# Patient Record
Sex: Female | Born: 1990
Health system: Southern US, Community
[De-identification: ages and names within clinical notes are randomized; demographics above are authoritative.]

## PROBLEM LIST (undated history)

## (undated) DIAGNOSIS — A048 Other specified bacterial intestinal infections: Secondary | ICD-10-CM

## (undated) DIAGNOSIS — K219 Gastro-esophageal reflux disease without esophagitis: Secondary | ICD-10-CM

## (undated) DIAGNOSIS — D649 Anemia, unspecified: Secondary | ICD-10-CM

## (undated) DIAGNOSIS — Z22322 Carrier or suspected carrier of Methicillin resistant Staphylococcus aureus: Secondary | ICD-10-CM

## (undated) HISTORY — DX: Other specified bacterial intestinal infections: A04.8

## (undated) HISTORY — DX: Gastro-esophageal reflux disease without esophagitis: K21.9

---
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
## 2008-06-15 ENCOUNTER — Emergency Department (HOSPITAL_COMMUNITY): Admission: EM | Admit: 2008-06-15 | Discharge: 2008-06-15 | Payer: Self-pay | Admitting: Emergency Medicine

## 2008-06-26 ENCOUNTER — Other Ambulatory Visit: Admission: RE | Admit: 2008-06-26 | Discharge: 2008-06-26 | Payer: Self-pay | Admitting: Family Medicine

## 2009-07-30 ENCOUNTER — Other Ambulatory Visit: Admission: RE | Admit: 2009-07-30 | Discharge: 2009-07-30 | Payer: Self-pay | Admitting: Obstetrics and Gynecology

## 2009-11-13 ENCOUNTER — Emergency Department (HOSPITAL_COMMUNITY): Admission: EM | Admit: 2009-11-13 | Discharge: 2009-11-13 | Payer: Self-pay | Admitting: Emergency Medicine

## 2010-01-20 ENCOUNTER — Emergency Department (HOSPITAL_COMMUNITY): Admission: EM | Admit: 2010-01-20 | Discharge: 2010-01-20 | Payer: Self-pay | Admitting: Emergency Medicine

## 2010-02-24 ENCOUNTER — Emergency Department (HOSPITAL_COMMUNITY): Admission: EM | Admit: 2010-02-24 | Discharge: 2010-02-24 | Payer: Self-pay | Admitting: Emergency Medicine

## 2010-03-23 ENCOUNTER — Emergency Department (HOSPITAL_COMMUNITY): Admission: EM | Admit: 2010-03-23 | Discharge: 2010-03-23 | Payer: Self-pay | Admitting: Family Medicine

## 2010-11-03 ENCOUNTER — Encounter: Payer: Self-pay | Admitting: Family Medicine

## 2011-01-01 LAB — CBC
Hemoglobin: 14.3 g/dL (ref 12.0–15.0)
MCHC: 34.7 g/dL (ref 30.0–36.0)
MCV: 95.2 fL (ref 78.0–100.0)
RBC: 4.31 MIL/uL (ref 3.87–5.11)

## 2011-01-01 LAB — PROTIME-INR
INR: 1.09 (ref 0.00–1.49)
Prothrombin Time: 14 seconds (ref 11.6–15.2)

## 2011-01-01 LAB — COMPREHENSIVE METABOLIC PANEL
ALT: 14 U/L (ref 0–35)
AST: 18 U/L (ref 0–37)
BUN: 5 mg/dL — ABNORMAL LOW (ref 6–23)
Creatinine, Ser: 0.61 mg/dL (ref 0.4–1.2)
GFR calc non Af Amer: >60 mL/min (ref >60)
Potassium: 3.5 mEq/L (ref 3.5–5.1)
Sodium: 139 mEq/L (ref 135–145)

## 2011-01-01 LAB — POCT PREGNANCY, URINE: Preg Test, Ur: NEGATIVE

## 2011-01-01 LAB — DIFFERENTIAL
Lymphs Abs: 2.3 10*3/uL (ref 0.7–4.0)
Monocytes Absolute: 0.6 10*3/uL (ref 0.1–1.0)
Monocytes Relative: 6 % (ref 3–12)

## 2011-01-01 LAB — URINALYSIS, ROUTINE W REFLEX MICROSCOPIC
Glucose, UA: NEGATIVE mg/dL
Ketones, ur: NEGATIVE mg/dL
Nitrite: NEGATIVE
Nitrite: NEGATIVE
Protein, ur: 100 mg/dL — AB
Specific Gravity, Urine: 1.016 (ref 1.005–1.030)
Specific Gravity, Urine: 1.024 (ref 1.005–1.030)
Urobilinogen, UA: 0.2 mg/dL (ref 0.0–1.0)
pH: 6.5 (ref 5.0–8.0)
pH: 7 (ref 5.0–8.0)

## 2011-01-01 LAB — LIPASE, BLOOD: Lipase: 22 U/L (ref 11–59)

## 2011-01-01 LAB — URINE MICROSCOPIC-ADD ON

## 2011-01-01 LAB — HEMOCCULT GUIAC POC 1CARD (OFFICE): Fecal Occult Bld: NEGATIVE

## 2011-01-01 LAB — APTT: aPTT: 28 seconds (ref 24–37)

## 2011-02-25 NOTE — Consult Note (Signed)
NAMESHARRY, BEINING               ACCOUNT NO.:  [id number]   MEDICAL RECORD NO.:  [id number]          PATIENT TYPE:  INP   LOCATION:  5511                         FACILITY:  [facility]   PHYSICIAN:  Antonietta Breach, M.D.  DATE OF BIRTH:  [birth year]   DATE OF CONSULTATION:  08/06/2007  DATE OF DISCHARGE:  08/07/2007                                 CONSULTATION   REASON FOR CONSULTATION:  Anxiety; adjust psychotropic medication.   HISTORY OF PRESENT ILLNESS:  Ms. [name] is an 20 year old female  admitted to the [facility] on August 05, 2007 after  experiencing periods of disequilibrium and falls.   The patient has been undergoing an organic evaluation of her falls and  feelings of imbalance.  There has been a negative organic workup.  The  patient does describe a number of stresses that she has been  experiencing.  She had anticipated going back to her favorite [place]  mom; however, that has not happened.  She is still grieving over a death  in the family.  Also, a good friend of hers died recently.  She does not  have any suicidal thoughts.  She has no thoughts of harming others.  She  has no hallucinations or delusions.  She does describe normal interests  in music.  She is not experiencing any racing thoughts.  She is  cooperative with bedside care and is socially appropriate.   Dictation ended at this point.      Antonietta Breach, M.D.  Electronically Signed     JW/MEDQ  D:  08/09/2007  T:  08/09/2007  Job:  [id number]
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
## 2011-02-25 NOTE — H&P (Signed)
[name], [name]               ACCOUNT NO.:  [id number]   MEDICAL RECORD NO.:  [id number]          PATIENT TYPE:  OBV   LOCATION:  5511                         FACILITY:  [facility]   PHYSICIAN:  Beckey Rutter, MD  DATE OF BIRTH:  [birth year]   DATE OF ADMISSION:  08/05/2007  DATE OF DISCHARGE:                              HISTORY & PHYSICAL   PRIMARY CARE PHYSICIAN:  Unassigned.   CHIEF COMPLAINT:  Fall.   HISTORY OF PRESENT ILLNESS:  This is an 20 year old Caucasian female  with past medical history of bipolar disorder, anxiety, depression and  self mutilation who presented today after she fell while she was  standing up.  The history was taken from the patient and her mother at  the bedside. The mother stated that she was just standing up when she  collapsed.  She said the patient did not hit the floor hard because some  people were trying to walk her since she started to feel weak and unable  to walk normally.  They denied urinary or fecal incontinence.  The  patient stated that she did not lose consciousness completely, but she  was feeling dizzy, lightheaded, and she felt the room was spinning  around her.  The patient then continued to feel weak and dizzy and  sometimes even nauseated, and she denied any similar spells in the past.  She stated that she has the dizzy feeling and the nausea for sometimes  on and off, and she also admitted to having some headaches also in an on  and off pattern.  She denied using any controlled substance other than  her prescription medication.  The patient has history of suicide attempt  and self cutting in the past but no history of medication overdose.   PAST MEDICAL HISTORY:  Is significant for:  1. Behavioral Health recent discharge.  2. Bipolar disorder.  3. Anxiety.  4. Depression.  5. Cutting self and suicidal ideation.   SOCIAL HISTORY:  The patient lives with a [place] mother. She denied  substance abuse.  Admits to occasional  smoking, and she denied alcohol  abuse.   FAMILY HISTORY:  The patient is unaware of her family history.   MEDICATION ALLERGY:  She is not known to have medication allergy.   MEDICATIONS:  1. Lamictal.  2. Seroquel.  3. Strattera.  4. Zoloft  5. Birth control pills.   REVIEW OF SYSTEMS:  The patient denied recent fever but admits to having  strep throat in the last week or so, and she admits to having some ear  problem to the right ear, though she denied recent discharge or recent  severe pain on the right ear.  The rest of Review of Systems as per HPI.   PHYSICAL EXAMINATION:  GENERAL:  The patient was lying flat in bed not  in acute distress.  HEENT:  Head:  Atraumatic, normocephalic.  Eyes: PERRLA.  Mouth moist.  No ulcer.  Ears:  The patient has perforated tympanic membrane in the  right ear. No obvious discharge.  NECK:  Supple.  The JVD.  LUNGS:  Bilateral fair air entry.  PRECORDIUM:  First and second heart sounds audible.  No added sounds.  ABDOMEN:  Soft, nontender.  Bowel sounds present.  EXTREMITIES: No lower extremities edema.  NEUROLOGIC:  Patient talking in a low voice but giving history and  engaging in the conversation.  She is alert, oriented x3.  On gait, her  gait seems stable when she walks, but when she stands up, she tends to  fall, not in one specific direction. She also needs a little bit of help  to climb up to bed.  She moves all of her extremities spontaneously.   LABORATORY AND X-RAY DATA:  EKG shows sinus rhythm with 95 beats per  minute heart rate, normal axis, normal QRS complex, and nonspecific T  wave changes.   Urine drug screen is positive for benzodiazepines only.  Her sodium is  133, potassium 4.1, chloride 104, carbon dioxide 24, glucose 114, BUN  10, creatinine 0.7, calcium 9.1, albumin  3.2 ALT 11, alkaline  phosphatase 96, total bilirubin 0.6.  Salicylate is less than 4.0.  Alcohol level is less than 5.  White blood count is 7.2,  hemoglobin  11.5, hematocrit 34.1, platelet count 300.   ASSESSMENT AND PLAN:  This is an 20 year old who came with drowsiness  and fall.  The patient, as per history, is unlikely to have cardiogenic  cause for her syncope, but she seems to have neurologic cause or  labyrinth problem mainly secondary to the infection in the right ear.  The patient up to now  is still having difficulty standing without  assistance, and I suspect her symptoms are secondary to excessive  sedative and psychotropic medication.  The patient will be admitted to  the telemetry floor for further assessment and management.  1. I will hold all medications for tonight and will obtain levels and      reassess her ability to stand in the morning.  I will also start      the patient on antibiotics for her right ear, and I will obtain CT      head or MRI imaging of the right ear.  2. Depending on the chest x-ray, I will consider 2-D echocardiogram      and carotid Doppler, though, again as per history, her symptoms are      unlikely to be cardiogenic in origin.  3. I will consider Lovenox for deep vein thrombosis prophylaxis.  4. I will also will consider psychiatric consultation if this patient      stays more than 24 hours in the hospital.      Beckey Rutter, MD  Electronically Signed     EME/MEDQ  D:  08/05/2007  T:  08/06/2007  Job:  [id number]

## 2011-02-28 NOTE — Discharge Summary (Signed)
[name], [name]               ACCOUNT NO.:  [id number]   MEDICAL RECORD NO.:  [id number]          PATIENT TYPE:  INP   LOCATION:  0101                          FACILITY:  [facility]   PHYSICIAN:  Lalla Brothers, MDDATE OF BIRTH:  [birth year]   DATE OF ADMISSION:  07/12/2007  DATE OF DISCHARGE:  07/19/2007                               DISCHARGE SUMMARY   IDENTIFICATION:  This 49[age]1/22-year-old female, senior at [employer], was admitted emergently voluntarily on referral from [name],  her psychotherapist, for inpatient stabilization and treatment of  suicide risk and depression.  The patient's self-injurious behavior over  the last several days had resulted in requiring 22 sutures in the left  breast for self-cutting, having progressive associated suicidal  ideation.  The patient has had significant relational disruption past  and present with that of the present reenacting the more severe of the  past.  For full details, please see the typed admission assessment.   SYNOPSIS OF PRESENT ILLNESS:  The patient has been under the custody of  [facility], Loreen Freud, at [phone]  with parental rights terminated at age 5, though she had been receiving  services and out of home placement since age 20 years.  The patient had  required a level five group home, at one point having multiple  hospitalizations since at least 2004 for self-mutilation, disruptive  behavior and depression and post-traumatic stress.  Biological parents  did have mental illness.  The patient has difficulty with individuation  separation as a teen and is somewhat overwhelmed with the prospect of  graduating from high school.  She has been depressed for the last four  hospitalizations.  Medications have been adjusted very slowly with  Seroquel decreased to 200 mg nightly, including for borderline  elevations of LDL cholesterol and triglycerides as well as being  somewhat  overweight.  At the time of admission, the patient is taking  Strattera 80 mg every morning, Zoloft 100 mg every morning, Lamictal 200  mg morning and bedtime, Klonopin 0.5 mg morning and bedtime and Seroquel  200 mg nightly.  She is also on Tri-Sprintec birth control pill and is  allergic to TOPAMAX, FOCALIN and PROZAC by history.  She had [facility] care  for four years with Lynne Logan after her level five group home  placement that is now for the last eight months been with a new [place]  mother as Agustin Cree is getting a divorce.  The patient seems to anticipate  returning to Darlene's home and has to face during this hospitalization  that that is not going to happen.   INITIAL MENTAL STATUS EXAM:  The patient is right-handed with intact  neurological exam.  She is severely dysphoric though she is asking for  help.  She becomes overwhelmed and drowsy just thinking about her losses  and changes in her life.  She has moderate post-traumatic anxiety with  reenactment self-injury following reexperiencing triggers.  She is not  manic or psychotic currently though she does have a history of a bipolar  diagnosis.  She is  having suicidal ideation and progressively severe and  serious self-injury.   LABORATORY FINDINGS:  In the emergency department, for suturing of her  breast laceration, basic metabolic panel was normal with random glucose  105, sodium 139, potassium 3.7, calcium 9 and creatinine 0.74.  Urine  drug screen was positive for benzodiazepine from her Klonopin otherwise  negative as was blood alcohol.  Urine pregnancy test was negative.  Salicylate and acetaminophen were negative.  At the [facility], CBC was normal except white count upper limit of normal at  10,700 with upper limit of normal 10,000 and platelet count was slightly  elevated at 407,000 with upper limit of normal 325,000.  Hemoglobin was  normal at 12.5, MCV at 88.7 and WBC differential was normal.   Hepatic  function panel was normal with albumin 3.5, total bilirubin 0.5, AST 18,  ALT 15 and GGT 26.  Basic metabolic panel was normal with sodium 140,  potassium 3.8, random glucose 94, creatinine 0.95 and calcium 10.  Free  T4 was normal at 0.96 and TSH at 3.789.  Urinalysis was normal with  specific gravity of 1.023 and pH 5.5 with 0-2 wbc, 0-2 rbc and a few  bacteria with some mucus present.   HOSPITAL COURSE AND TREATMENT:  General medical exam by Jorje Guild PA-C  noted ventilation tubes in the tympanic membranes and a history of  pneumonia around age 20.  She had cat scratch disease at age 20.  She has  an occasional cigarette.  She had menarche at age 20 with regular menses  and has been sexually active apparently once in the interim since her  last hospitalization and then has ceased sexual activity again.  BMI is  25.8.  Height was 150 cm and weight on admission was 58 kg and on  discharge was 59.4 kg, having been 57.7 kg in July of 2007 at the time  of her last hospitalization.  Initial supine blood pressure was 111/65  with heart rate of 77 and standing blood pressure 111/74 with heart rate  of 126.  On the day before discharge, supine blood pressure was 139/68  with heart rate of 122 and standing blood pressure was 115/70 with heart  rate of 110.  Two days prior to discharge, supine blood pressure was  93/60 with heart rate of 74 and standing blood pressure 101/62 with  heart rate of 97.  Sitting blood pressure on the day of discharge was  135/81 with heart rate of 135.  The patient seems to have varying vital  signs based on a pattern of no nutrition and somewhat involutional and  regressive underactivity.  The patient's Seroquel was reduced from 200  mg to 100 mg nightly and otherwise medications were continued without  change with Tri-Sprintec being brought from [place] home.  The patient  decompensated further after a TDM meeting disclosed to her that she  would not be  returning to Darlene's home.  Subsequently, the patient  began to work step by step on how to emotionally cope and maturely  advance her level of responsibility and activity for finding more  rewarding relations and investment in her future.  The patient reworked  past trauma to the extent that she could be more constructive in  upcoming relationships.  She visited with current [place] mother and the  [place] mother's baby on the unit.  The patient manifested some maturity  and asserting to staff that she can legally sign herself out of  the  hospital at discharge because she is 16.  The patient gradually improved  relative to her self-injurious ideation, risk and acting upon such.  Sutures were removed prior to discharge and wound was healing adequately  relative to left breast self-inflicted injuries.  The patient partially  worked through her regression though having a headache on the morning of  discharge.  She was still sleeping too much at times but overall  motivated to participate in treatment.  Suicidal ideation resolved and  she was no longer self-injurious.  She was discharged in improved  condition to return to her [place] home.  She is motivated to return to  school at the time of discharge.  She required no seclusion or restraint  during the hospital stay.   FINAL DIAGNOSES:  AXIS I:  Bipolar disorder, depressed, severe.  Post-  traumatic stress disorder.  Attention-deficit hyperactivity disorder,  combined-type, moderate severity.  Oppositional defiant disorder.  Other  interpersonal problem.  Other specified family circumstances.  Parent-  child problem.  AXIS II:  Diagnosis deferred.  AXIS III:  Self-inflicted lacerations left breast, overweight with  history of borderline elevation of LDL cholesterol and triglycerides at  133 and 191 respectively, short stature, allergy to TOPAMAX and FOCALIN  and PROZAC, iron deficiency anemia resolved.  AXIS IV:  Stressors:   Family--extreme, acute and chronic; phase of life-  -extreme, acute and chronic; peer relations--moderate, acute and  chronic; school--moderate, acute and chronic.  AXIS V:  GAF on admission 35; highest in last year estimated at 65;  discharge GAF 53.   CONDITION ON DISCHARGE:  The patient was discharged to [place] mother in  improved condition, free of suicidal and homicidal ideation.   ACTIVITY/DIET:  She follows a weight and fat control diet and has no  restrictions on physical activity.  Left breast wounds are healing well  requiring only protection from further injury and scar care.  She  requires no pain management.  Crisis and safety plans are outlined if  needed.  She is discharged on the following medication.   DISCHARGE MEDICATIONS:  1. Strattera 80 mg every morning; quantity #30 prescribed.  2. Zoloft 100 mg every morning; quantity #30 prescribed.  3. Lamictal 200 mg every morning and bedtime; quantity #60 prescribed.  4. Seroquel was reduced to 100 mg tablet every bedtime; quantity #30      prescribed.  5. Klonopin 0.5 mg every morning and bedtime; quantity #60 prescribed.  6. Tri-Sprintec 1 daily; own home supply is continued.   FOLLOWUP:  She sees Dr. Elsie Saas August 16, 2007 at 1715 for  psychiatry follow-up.  She sees [name] at [facility] July 19, 2007 at 4782 for therapy.  She is educated on the  medication including adjustments and guidelines.      Lalla Brothers, MD  Electronically Signed     GEJ/MEDQ  D:  07/21/2007  T:  07/21/2007  Job:  [id number]
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
## 2011-07-16 LAB — POCT I-STAT, CHEM 8
BUN: 13
Calcium, Ion: 1.26
Chloride: 107
TCO2: 25

## 2011-07-16 LAB — URINALYSIS, ROUTINE W REFLEX MICROSCOPIC
Ketones, ur: NEGATIVE
Nitrite: NEGATIVE
pH: 5

## 2011-07-16 LAB — POCT PREGNANCY, URINE: Preg Test, Ur: NEGATIVE

## 2011-07-16 LAB — DIFFERENTIAL
Eosinophils Absolute: 0.1
Eosinophils Relative: 2
Lymphocytes Relative: 29
Monocytes Relative: 10
Neutrophils Relative %: 59

## 2011-07-16 LAB — CBC
MCV: 93.6
WBC: 6.9

## 2011-07-22 ENCOUNTER — Inpatient Hospital Stay (INDEPENDENT_AMBULATORY_CARE_PROVIDER_SITE_OTHER)
Admission: RE | Admit: 2011-07-22 | Discharge: 2011-07-22 | Disposition: A | Discharge status: Home / Self Care | Payer: Self-pay | Source: Ambulatory Visit | Attending: Emergency Medicine | Admitting: Emergency Medicine

## 2011-07-22 DIAGNOSIS — L02219 Cutaneous abscess of trunk, unspecified: Secondary | ICD-10-CM

## 2011-07-22 LAB — COMPREHENSIVE METABOLIC PANEL
Alkaline Phosphatase: 113
BUN: 8
CO2: 26
Chloride: 102
GFR calc non Af Amer: >60
Glucose, Bld: 75
Potassium: 3.9
Total Bilirubin: 0.6

## 2011-07-22 LAB — DRUGS OF ABUSE SCREEN W/O ALC, ROUTINE URINE
Amphetamine Screen, Ur: NEGATIVE
Barbiturate Quant, Ur: NEGATIVE
Benzodiazepines.: NEGATIVE
Phencyclidine (PCP): NEGATIVE
Propoxyphene: NEGATIVE

## 2011-07-22 LAB — LIPID PANEL: Cholesterol: 245 — ABNORMAL HIGH

## 2011-07-22 LAB — URINALYSIS, ROUTINE W REFLEX MICROSCOPIC
Glucose, UA: NEGATIVE
Hgb urine dipstick: NEGATIVE
Specific Gravity, Urine: 1.033 — ABNORMAL HIGH

## 2011-07-22 LAB — FERRITIN: Ferritin: 18 (ref 10–291)

## 2011-07-23 LAB — RAPID URINE DRUG SCREEN, HOSP PERFORMED
Barbiturates: NOT DETECTED
Cocaine: NOT DETECTED
Opiates: NOT DETECTED

## 2011-07-23 LAB — COMPREHENSIVE METABOLIC PANEL
AST: 16
Albumin: 3 — ABNORMAL LOW
Albumin: 3.2 — ABNORMAL LOW
Alkaline Phosphatase: 96
BUN: 10
BUN: 7
Calcium: 9.6
Chloride: 104
Chloride: 104
Creatinine, Ser: 0.67
Creatinine, Ser: 0.7
GFR calc Af Amer: >60
GFR calc non Af Amer: >60
GFR calc non Af Amer: >60
Glucose, Bld: 114 — ABNORMAL HIGH
Total Bilirubin: 0.4
Total Bilirubin: 0.6

## 2011-07-23 LAB — CK TOTAL AND CKMB (NOT AT ARMC)
CK, MB: 0.8
Relative Index: INVALID
Total CK: 47

## 2011-07-23 LAB — DIFFERENTIAL
Basophils Absolute: 0
Basophils Relative: 0
Lymphocytes Relative: 32
Monocytes Absolute: 0.6
Neutro Abs: 4.1
Neutrophils Relative %: 57

## 2011-07-23 LAB — CBC
HCT: 33.8 — ABNORMAL LOW
HCT: 34.1 — ABNORMAL LOW
Hemoglobin: 11.5 — ABNORMAL LOW
MCHC: 33.7
MCV: 87.8
MCV: 89.2
Platelets: 300
Platelets: 304
WBC: 7.2

## 2011-07-23 LAB — CARDIAC PANEL(CRET KIN+CKTOT+MB+TROPI)
CK, MB: 0.5
Relative Index: INVALID
Relative Index: INVALID
Total CK: 42
Troponin I: 0.02

## 2011-07-23 LAB — SALICYLATE LEVEL: Salicylate Lvl: <4

## 2011-07-23 LAB — TROPONIN I: Troponin I: 0.01

## 2011-07-24 LAB — RAPID URINE DRUG SCREEN, HOSP PERFORMED
Amphetamines: NOT DETECTED
Barbiturates: NOT DETECTED
Opiates: NOT DETECTED
Tetrahydrocannabinol: NOT DETECTED

## 2011-07-24 LAB — DIFFERENTIAL
Basophils Absolute: 0
Lymphocytes Relative: 24
Lymphs Abs: 2.5
Monocytes Absolute: 0.8
Monocytes Relative: 8
Neutro Abs: 7.2 — ABNORMAL HIGH

## 2011-07-24 LAB — URINALYSIS, MICROSCOPIC ONLY
Glucose, UA: NEGATIVE
Ketones, ur: NEGATIVE
Leukocytes, UA: NEGATIVE
pH: 5.5

## 2011-07-24 LAB — BASIC METABOLIC PANEL
Calcium: 10
Calcium: 9
GFR calc Af Amer: >60
GFR calc Af Amer: >60
GFR calc non Af Amer: >60
GFR calc non Af Amer: >60
Potassium: 3.7
Sodium: 139
Sodium: 140

## 2011-07-24 LAB — HEPATIC FUNCTION PANEL
ALT: 15
AST: 18
Albumin: 3.5
Alkaline Phosphatase: 114
Total Bilirubin: 0.5

## 2011-07-24 LAB — CBC
Hemoglobin: 12.5
RBC: 4.15

## 2011-07-24 LAB — SALICYLATE LEVEL: Salicylate Lvl: <4

## 2011-07-24 LAB — ACETAMINOPHEN LEVEL: Acetaminophen (Tylenol), Serum: <10 — ABNORMAL LOW

## 2011-07-24 LAB — GAMMA GT: GGT: 26

## 2011-07-25 LAB — CULTURE, ROUTINE-ABSCESS

## 2012-02-19 ENCOUNTER — Other Ambulatory Visit: Payer: Self-pay

## 2012-02-19 ENCOUNTER — Other Ambulatory Visit: Payer: Self-pay | Admitting: Internal Medicine

## 2012-02-20 ENCOUNTER — Ambulatory Visit
Admission: RE | Admit: 2012-02-20 | Discharge: 2012-02-20 | Disposition: A | Discharge status: Home / Self Care | Payer: Self-pay | Source: Ambulatory Visit | Attending: Internal Medicine | Admitting: Internal Medicine

## 2015-01-15 ENCOUNTER — Emergency Department (HOSPITAL_BASED_OUTPATIENT_CLINIC_OR_DEPARTMENT_OTHER): Payer: No Typology Code available for payment source

## 2015-01-15 ENCOUNTER — Encounter (HOSPITAL_BASED_OUTPATIENT_CLINIC_OR_DEPARTMENT_OTHER): Payer: Self-pay

## 2015-01-15 ENCOUNTER — Emergency Department (HOSPITAL_BASED_OUTPATIENT_CLINIC_OR_DEPARTMENT_OTHER)
Admission: EM | Admit: 2015-01-15 | Discharge: 2015-01-15 | Disposition: A | Discharge status: Home / Self Care | Payer: No Typology Code available for payment source | Attending: Emergency Medicine | Admitting: Emergency Medicine

## 2015-01-15 DIAGNOSIS — R000 Tachycardia, unspecified: Secondary | ICD-10-CM | POA: Insufficient documentation

## 2015-01-15 DIAGNOSIS — R05 Cough: Secondary | ICD-10-CM

## 2015-01-15 DIAGNOSIS — R059 Cough, unspecified: Secondary | ICD-10-CM

## 2015-01-15 DIAGNOSIS — Z720 Tobacco use: Secondary | ICD-10-CM | POA: Insufficient documentation

## 2015-01-15 DIAGNOSIS — J40 Bronchitis, not specified as acute or chronic: Secondary | ICD-10-CM | POA: Insufficient documentation

## 2015-01-15 DIAGNOSIS — R1110 Vomiting, unspecified: Secondary | ICD-10-CM | POA: Insufficient documentation

## 2015-01-15 MED ORDER — ALBUTEROL SULFATE HFA 108 (90 BASE) MCG/ACT IN AERS: 2.0000 | INHALATION_SPRAY | Freq: Once | RESPIRATORY_TRACT | Status: DC

## 2015-01-15 MED ORDER — IPRATROPIUM-ALBUTEROL 0.5-2.5 (3) MG/3ML IN SOLN
3.0000 mL | Freq: Once | RESPIRATORY_TRACT | Status: AC
  Administered 2015-01-15: 3 mL via RESPIRATORY_TRACT
  Filled 2015-01-15: qty 3

## 2015-01-15 MED ORDER — ALBUTEROL SULFATE HFA 108 (90 BASE) MCG/ACT IN AERS
2.0000 | INHALATION_SPRAY | Freq: Once | RESPIRATORY_TRACT | Status: AC
  Administered 2015-01-15: 2 via RESPIRATORY_TRACT

## 2015-01-15 MED ORDER — IPRATROPIUM BROMIDE 0.02 % IN SOLN: 0.5000 mg | Freq: Once | RESPIRATORY_TRACT | Status: DC

## 2015-01-15 MED ORDER — ALBUTEROL SULFATE HFA 108 (90 BASE) MCG/ACT IN AERS
INHALATION_SPRAY | RESPIRATORY_TRACT | Status: AC
  Filled 2015-01-15: qty 6.7

## 2015-01-15 MED ORDER — ALBUTEROL SULFATE (2.5 MG/3ML) 0.083% IN NEBU
2.5000 mg | INHALATION_SOLUTION | Freq: Once | RESPIRATORY_TRACT | Status: AC
  Administered 2015-01-15: 2.5 mg via RESPIRATORY_TRACT
  Filled 2015-01-15: qty 3

## 2015-01-15 NOTE — ED Provider Notes (Signed)
CSN: [id number]     Arrival date & time 01/15/15  1302 History   First MD Initiated Contact with Patient 01/15/15 1308     Chief Complaint  Patient presents with  . Cough     (Consider location/radiation/quality/duration/timing/severity/associated sxs/prior Treatment) HPI Comments: 24 year old female complaining of cough and fever 4 days. Cough is productive with dark green phlegm. Admits to subjective fever and chills. Many other people at work are sick with similar symptoms, and have been diagnosed with bronchitis. Admits to nausea and one episode of vomiting earlier today after trying to take Robitussin. Denies shortness of breath. She is a smoker.  Patient is a 24 y.o. female presenting with cough. The history is provided by the patient and the spouse.  Cough Associated symptoms: chills and fever     History reviewed. No pertinent past medical history. History reviewed. No pertinent past surgical history. No family history on file. History  Substance Use Topics  . Smoking status: Current Every Day Smoker  . Smokeless tobacco: Not on file  . Alcohol Use: No   OB History    No data available     Review of Systems  Constitutional: Positive for fever and chills.  Respiratory: Positive for cough.   Gastrointestinal: Positive for vomiting.  All other systems reviewed and are negative.     Allergies  Review of patient's allergies indicates no known allergies.  Home Medications   Prior to Admission medications   Not on File   BP 131/83 mmHg  Pulse 104  Temp(Src) 99.1 F (37.3 C) (Oral)  Resp 16  Ht 4' 11.5" (1.511 m)  SpO2 100%  LMP 12/14/2014 Physical Exam  Constitutional: She is oriented to person, place, and time. She appears well-developed and well-nourished. No distress.  HENT:  Head: Normocephalic and atraumatic.  Mouth/Throat: Oropharynx is clear and moist.  Eyes: Conjunctivae and EOM are normal.  Neck: Normal range of motion. Neck supple.   Cardiovascular: Normal rate, regular rhythm and normal heart sounds.   Mildly tachycardic.  Pulmonary/Chest: Effort normal. No respiratory distress.  Few, mild scattered rhonchi bilateral.  Musculoskeletal: Normal range of motion. She exhibits no edema.  Neurological: She is alert and oriented to person, place, and time. No sensory deficit.  Skin: Skin is warm and dry.  Psychiatric: She has a normal mood and affect. Her behavior is normal.  Nursing note and vitals reviewed.   ED Course  Procedures (including critical care time) Labs Review Labs Reviewed - No data to display  Imaging Review Dg Chest 2 View  01/15/2015   CLINICAL DATA:  Productive cough for 4 days.  EXAM: CHEST  2 VIEW  COMPARISON:  None.  FINDINGS: The heart size and mediastinal contours are within normal limits. Both lungs are clear. The visualized skeletal structures are unremarkable.  IMPRESSION: No active cardiopulmonary disease.   Electronically Signed   By: Jolaine Click M.D.   On: 01/15/2015 13:35     EKG Interpretation None      MDM   Final diagnoses:  Bronchitis  Cough   Pt with fevers, productive cough, scattered ronchi. Non-toxic appearing, no respiratory distress. O2 sat 98% on RA. She is a smoker. Plan to obtain chest x-ray, and give DuoNeb.  1:42 PM Chest x-ray clear. Lungs completely clear after DuoNeb treatment. Will discharge patient home with albuterol inhaler. Advised over-the-counter Delsym or Robitussin for cough. Stable for discharge. Return precautions given. Patient states understanding of treatment care plan and is agreeable.  Nada Boozer  Lashina Milles, PA-C 01/15/15 1609  Eber HongBrian Miller, MD 01/15/15 2032

## 2015-01-15 NOTE — ED Notes (Signed)
Cough/fever since 4/1

## 2015-01-15 NOTE — Discharge Instructions (Signed)

## 2015-02-08 ENCOUNTER — Emergency Department (HOSPITAL_BASED_OUTPATIENT_CLINIC_OR_DEPARTMENT_OTHER)
Admission: EM | Admit: 2015-02-08 | Discharge: 2015-02-08 | Disposition: A | Discharge status: Home / Self Care | Payer: No Typology Code available for payment source | Attending: Emergency Medicine | Admitting: Emergency Medicine

## 2015-02-08 ENCOUNTER — Encounter (HOSPITAL_BASED_OUTPATIENT_CLINIC_OR_DEPARTMENT_OTHER): Payer: Self-pay | Admitting: *Deleted

## 2015-02-08 DIAGNOSIS — L0291 Cutaneous abscess, unspecified: Secondary | ICD-10-CM

## 2015-02-08 DIAGNOSIS — Z720 Tobacco use: Secondary | ICD-10-CM | POA: Insufficient documentation

## 2015-02-08 DIAGNOSIS — L02211 Cutaneous abscess of abdominal wall: Secondary | ICD-10-CM | POA: Insufficient documentation

## 2015-02-08 MED ORDER — CLINDAMYCIN HCL 150 MG PO CAPS: 150.0000 mg | ORAL_CAPSULE | Freq: Four times a day (QID) | ORAL | Status: DC

## 2015-02-08 MED ORDER — LIDOCAINE-EPINEPHRINE (PF) 2 %-1:200000 IJ SOLN
INTRAMUSCULAR | Status: AC
  Filled 2015-02-08: qty 10

## 2015-02-08 MED ORDER — HYDROCODONE-ACETAMINOPHEN 5-325 MG PO TABS: 2.0000 | ORAL_TABLET | ORAL | Status: DC | PRN

## 2015-02-08 NOTE — ED Notes (Signed)
MD at bedside. 

## 2015-02-08 NOTE — ED Provider Notes (Signed)
CSN: [id number]     Arrival date & time 02/08/15  1541 History   First MD Initiated Contact with Patient 02/08/15 1600     Chief Complaint  Patient presents with  . Skin Problem     (Consider location/radiation/quality/duration/timing/severity/associated sxs/prior Treatment) HPI  History reviewed. No pertinent past medical history. History reviewed. No pertinent past surgical history. History reviewed. No pertinent family history. History  Substance Use Topics  . Smoking status: Current Every Day Smoker  . Smokeless tobacco: Not on file  . Alcohol Use: No   OB History    No data available     Review of Systems    Allergies  Review of patient's allergies indicates no known allergies.  Home Medications   Prior to Admission medications   Medication Sig Start Date End Date Taking? Authorizing Provider  clindamycin (CLEOCIN) 150 MG capsule Take 1 capsule (150 mg total) by mouth every 6 (six) hours. 02/08/15   Nelva Nayobert Ivory Bail, MD   BP 131/80 mmHg  Pulse 91  Temp(Src) 99.2 F (37.3 C) (Oral)  Resp 22  SpO2 99%  LMP 12/14/2014 Physical Exam  Constitutional: She is oriented to person, place, and time. She appears well-developed and well-nourished. No distress.  HENT:  Head: Normocephalic and atraumatic.  Eyes: Pupils are equal, round, and reactive to light.  Neck: Normal range of motion.  Cardiovascular: Normal rate and intact distal pulses.   Pulmonary/Chest: No respiratory distress.  Abdominal: Normal appearance. She exhibits no distension.  Musculoskeletal: Normal range of motion.  Neurological: She is alert and oriented to person, place, and time. No cranial nerve deficit.  Skin: Skin is warm and dry. No rash noted.     Psychiatric: She has a normal mood and affect. Her behavior is normal.  Nursing note and vitals reviewed.   ED Course  INCISION AND DRAINAGE Date/Time: 02/08/2015 4:19 PM Performed by: Nelva NayBEATON, Quinlyn Tep Authorized by: Nelva NayBEATON, Aliesha Dolata Consent:  Verbal consent obtained. Written consent not obtained. Risks and benefits: risks, benefits and alternatives were discussed Consent given by: patient Patient identity confirmed: verbally with patient Type: abscess Body area: trunk Location details: abdomen Anesthesia: local infiltration Local anesthetic: lidocaine 2% with epinephrine Anesthetic total: 5 ml Patient sedated: no Scalpel size: 11 Needle gauge: 22 Incision type: single straight Complexity: simple Drainage: purulent Drainage amount: moderate Wound treatment: wound left open Packing material: none Patient tolerance: Patient tolerated the procedure well with no immediate complications   (including critical care time)   Labs Review Labs Reviewed - No data to display  Imaging Review No results found.   EKG Interpretation None      MDM   Final diagnoses:  Abscess       Nelva Nayobert Gaelle Adriance, MD 02/08/15 1620

## 2015-02-08 NOTE — Discharge Instructions (Signed)

## 2015-02-08 NOTE — ED Notes (Signed)
Pt amb to triage with quick steady gait smiling in nad, reports "bump" to her pubic line since shaving on Monday. Pt states it has gotten bigger since then.

## 2015-02-11 ENCOUNTER — Telehealth: Payer: Self-pay | Admitting: Emergency Medicine

## 2015-02-11 LAB — CULTURE, ROUTINE-ABSCESS

## 2015-02-11 NOTE — Telephone Encounter (Signed)
Positive Abscess culture + MRSA Treated with Clindamycin, sensitive to same per protocol MD Will contact patient with positive result.

## 2015-02-12 ENCOUNTER — Telehealth (HOSPITAL_COMMUNITY): Payer: Self-pay

## 2015-02-12 NOTE — Telephone Encounter (Signed)
Post ED Visit - Positive Culture Follow-up  Culture report reviewed by antimicrobial stewardship pharmacist: []  Wes Dulaney, Pharm.D., BCPS [x]  Celedonio MiyamotoJeremy Frens, Pharm.D., BCPS []  Georgina PillionElizabeth Martin, Pharm.D., BCPS []  [place] BeachMinh Pham, [address]Ph[address]D., BCPS, AAHIVP []  Estella HuskMichelle Turner, Pharm.D., BCPS, AAHIVP []  Elder CyphersLorie Poole, [address]Pharm[address]D., BCPS  Positive Abscess culture, -> moderate MRSA Treated with Clindamycin, organism sensitive to the same and no further patient follow-up is required at this time.  Arvid RightClark, Gracianna Vink Dorn 02/12/2015, 7:05 PM

## 2015-02-14 ENCOUNTER — Telehealth (HOSPITAL_BASED_OUTPATIENT_CLINIC_OR_DEPARTMENT_OTHER): Payer: Self-pay | Admitting: Emergency Medicine

## 2015-03-11 ENCOUNTER — Emergency Department (HOSPITAL_BASED_OUTPATIENT_CLINIC_OR_DEPARTMENT_OTHER)
Admission: EM | Admit: 2015-03-11 | Discharge: 2015-03-11 | Disposition: A | Discharge status: Home / Self Care | Payer: No Typology Code available for payment source | Attending: Emergency Medicine | Admitting: Emergency Medicine

## 2015-03-11 ENCOUNTER — Encounter (HOSPITAL_BASED_OUTPATIENT_CLINIC_OR_DEPARTMENT_OTHER): Payer: Self-pay

## 2015-03-11 ENCOUNTER — Emergency Department (HOSPITAL_BASED_OUTPATIENT_CLINIC_OR_DEPARTMENT_OTHER): Payer: No Typology Code available for payment source

## 2015-03-11 DIAGNOSIS — L03119 Cellulitis of unspecified part of limb: Secondary | ICD-10-CM

## 2015-03-11 DIAGNOSIS — L0291 Cutaneous abscess, unspecified: Secondary | ICD-10-CM

## 2015-03-11 DIAGNOSIS — L03115 Cellulitis of right lower limb: Secondary | ICD-10-CM | POA: Insufficient documentation

## 2015-03-11 DIAGNOSIS — L02415 Cutaneous abscess of right lower limb: Secondary | ICD-10-CM | POA: Insufficient documentation

## 2015-03-11 DIAGNOSIS — Z720 Tobacco use: Secondary | ICD-10-CM | POA: Insufficient documentation

## 2015-03-11 DIAGNOSIS — L02419 Cutaneous abscess of limb, unspecified: Secondary | ICD-10-CM

## 2015-03-11 MED ORDER — CEPHALEXIN 500 MG PO CAPS: 500.0000 mg | ORAL_CAPSULE | Freq: Four times a day (QID) | ORAL | Status: DC

## 2015-03-11 MED ORDER — HYDROCODONE-ACETAMINOPHEN 5-325 MG PO TABS: 1.0000 | ORAL_TABLET | Freq: Four times a day (QID) | ORAL | Status: DC | PRN

## 2015-03-11 MED ORDER — HYDROMORPHONE HCL 1 MG/ML IJ SOLN
1.0000 mg | Freq: Once | INTRAMUSCULAR | Status: AC
  Administered 2015-03-11: 1 mg via INTRAVENOUS
  Filled 2015-03-11: qty 1

## 2015-03-11 MED ORDER — SULFAMETHOXAZOLE-TRIMETHOPRIM 800-160 MG PO TABS: 1.0000 | ORAL_TABLET | Freq: Two times a day (BID) | ORAL | Status: DC

## 2015-03-11 MED ORDER — LIDOCAINE-EPINEPHRINE 2 %-1:100000 IJ SOLN
20.0000 mL | Freq: Once | INTRAMUSCULAR | Status: AC
  Administered 2015-03-11: 20 mL
  Filled 2015-03-11: qty 1

## 2015-03-11 NOTE — ED Notes (Addendum)
Pt c/o 2 abcesses on right mid leg.  One on right knee with large area (4"+) of redness surrounding.  Small abcess noted above right knee with minimal redness and swelling surrounding.  Larger abcess present for 3 days, smaller abcess just noticed last night.  Pt h/o abcess with +MRSA 3 weeks ago to right groin region, tx'd with clindamycin which made pt feel very nauseous.

## 2015-03-11 NOTE — ED Provider Notes (Signed)
CSN: [id number]     Arrival date & time 03/11/15  1033 History   First MD Initiated Contact with Patient 03/11/15 1121     Chief Complaint  Patient presents with  . Abscess     (Consider location/radiation/quality/duration/timing/severity/associated sxs/prior Treatment) Patient is a 24 y.o. female presenting with abscess. The history is provided by the patient.  Abscess Location:  Leg Leg abscess location:  R upper leg and R knee Abscess quality: fluctuance, induration, painful, redness and warmth   Red streaking: yes   Duration:  3 days Progression:  Worsening Pain details:    Quality:  Sharp and shooting   Severity:  Severe   Timing:  Constant   Progression:  Worsening Chronicity:  Recurrent Context: skin injury   Context: not diabetes and not injected drug use   Relieved by:  Nothing Worsened by:  Draining/squeezing Associated symptoms: nausea   Associated symptoms: no anorexia and no fever   Risk factors: hx of MRSA and prior abscess     History reviewed. No pertinent past medical history. History reviewed. No pertinent past surgical history. No family history on file. History  Substance Use Topics  . Smoking status: Current Every Day Smoker  . Smokeless tobacco: Not on file  . Alcohol Use: No   OB History    No data available     Review of Systems  Constitutional: Negative for fever.  Gastrointestinal: Positive for nausea. Negative for anorexia.  All other systems reviewed and are negative.     Allergies  Review of patient's allergies indicates no known allergies.  Home Medications   Prior to Admission medications   Medication Sig Start Date End Date Taking? Authorizing Provider  cephALEXin (KEFLEX) 500 MG capsule Take 1 capsule (500 mg total) by mouth 4 (four) times daily. 03/11/15   Gwyneth Sprout, MD  HYDROcodone-acetaminophen (NORCO/VICODIN) 5-325 MG per tablet Take 1-2 tablets by mouth every 6 (six) hours as needed. 03/11/15   Gwyneth Sprout,  MD  sulfamethoxazole-trimethoprim (BACTRIM DS,SEPTRA DS) 800-160 MG per tablet Take 1 tablet by mouth 2 (two) times daily. 03/11/15 03/18/15  Gwyneth Sprout, MD   BP 117/65 mmHg  Pulse 87  Temp(Src) 98.7 F (37.1 C) (Oral)  Resp 16  Ht  (1.499 m)  Wt 130 lb (58.968 kg)  BMI 26.24 kg/m2  SpO2 98% Physical Exam  Constitutional: She is oriented to person, place, and time. She appears well-developed and well-nourished. No distress.  HENT:  Head: Normocephalic and atraumatic.  Mouth/Throat: Oropharynx is clear and moist.  Eyes: Conjunctivae and EOM are normal. Pupils are equal, round, and reactive to light.  Neck: Normal range of motion. Neck supple.  Cardiovascular: Normal rate, regular rhythm and intact distal pulses.   No murmur heard. Pulmonary/Chest: Effort normal and breath sounds normal. No respiratory distress. She has no wheezes. She has no rales.  Musculoskeletal: Normal range of motion. She exhibits tenderness. She exhibits no edema.  Neurological: She is alert and oriented to person, place, and time.  Skin: Skin is warm and dry. No rash noted. There is erythema.     Psychiatric: She has a normal mood and affect. Her behavior is normal.  Nursing note and vitals reviewed.   ED Course  Procedures (including critical care time) Labs Review Labs Reviewed - No data to display  Imaging Review Dg Knee 1-2 Views Right  03/11/2015   CLINICAL DATA:  Right knee swelling, lateral knee abscess and redness  EXAM: RIGHT KNEE - 1-2 VIEW  COMPARISON:  None.  FINDINGS: Two views of the right knee submitted. No acute fracture or subluxation. No joint effusion. No radiopaque foreign body.  IMPRESSION: Negative.   Electronically Signed   By: Natasha MeadLiviu  Pop M.D.   On: 03/11/2015 11:52     EKG Interpretation None       INCISION AND DRAINAGE Performed by: Gwyneth SproutPLUNKETT,Yuka Lallier Consent: Verbal consent obtained. Risks and benefits: risks, benefits and alternatives were discussed Type:  abscess  Body area: right thigh  Anesthesia: local infiltration  Incision was made with a scalpel.  Local anesthetic: lidocaine 2% with epinephrine  Anesthetic total: 2 ml  Complexity: complex Blunt dissection to break up loculations  Drainage: purulent  Drainage amount: Scant   Packing material: none  Patient tolerance: Patient tolerated the procedure well with no immediate complications.  INCISION AND DRAINAGE Performed by: Gwyneth SproutPLUNKETT,Tc [place]usta Consent: Verbal consent obtained. Risks and benefits: risks, benefits and alternatives were discussed Type: abscess  Body area: Right lateral knee  Anesthesia: local infiltration  Incision was made with a scalpel.  Local anesthetic: lidocaine 2 % with epinephrine  Anesthetic total: 5 ml  Complexity: complex Blunt dissection to break up loculations  Drainage: purulent  Drainage amount: 4 mL   Packing material: none  Patient tolerance: Patient tolerated the procedure well with no immediate complications.      MDM   Final diagnoses:  Cellulitis and abscess of leg    Patient here with 2 abscesses. The most significant abscesses on the lateral portion of the right knee. Significant surrounding cellulitis without systemic symptoms at this time. Patient has some swelling to the knee and pain with walking and bending the knee however feel mostly related to the abscess on the skin. Low suspicion for septic joint at this time. X-ray of the knee is negative for effusion.  I&D as above. Patient was started on Bactrim and Keflex as she did not tolerate clindamycin well which she was on 3 weeks ago. Patient given strict return precautions and discharged.    Gwyneth SproutWhitney Cortasia Screws, MD 03/11/15 1328

## 2015-03-11 NOTE — Discharge Instructions (Signed)

## 2015-03-11 NOTE — ED Notes (Signed)
Patient here with right knee redness and swelling x 4 days, denies drainage. Also has painful red area to right thigh x 2 days. Hx of abscess. Has been running fever with same

## 2015-03-12 ENCOUNTER — Encounter (HOSPITAL_COMMUNITY): Payer: Self-pay | Admitting: *Deleted

## 2015-03-12 ENCOUNTER — Inpatient Hospital Stay (HOSPITAL_COMMUNITY)
Admission: EM | Admit: 2015-03-12 | Discharge: 2015-03-15 | DRG: 603 | Disposition: A | Discharge status: Home / Self Care | Payer: Self-pay | Attending: Internal Medicine | Admitting: Internal Medicine

## 2015-03-12 DIAGNOSIS — L0390 Cellulitis, unspecified: Secondary | ICD-10-CM | POA: Diagnosis present

## 2015-03-12 DIAGNOSIS — L03115 Cellulitis of right lower limb: Principal | ICD-10-CM | POA: Diagnosis present

## 2015-03-12 DIAGNOSIS — L0291 Cutaneous abscess, unspecified: Secondary | ICD-10-CM

## 2015-03-12 DIAGNOSIS — Z8614 Personal history of Methicillin resistant Staphylococcus aureus infection: Secondary | ICD-10-CM

## 2015-03-12 DIAGNOSIS — L02415 Cutaneous abscess of right lower limb: Secondary | ICD-10-CM | POA: Diagnosis present

## 2015-03-12 DIAGNOSIS — F17210 Nicotine dependence, cigarettes, uncomplicated: Secondary | ICD-10-CM | POA: Diagnosis present

## 2015-03-12 DIAGNOSIS — D72829 Elevated white blood cell count, unspecified: Secondary | ICD-10-CM

## 2015-03-12 DIAGNOSIS — B9562 Methicillin resistant Staphylococcus aureus infection as the cause of diseases classified elsewhere: Secondary | ICD-10-CM | POA: Diagnosis present

## 2015-03-12 LAB — CBC WITH DIFFERENTIAL/PLATELET
Basophils Absolute: 0 10*3/uL (ref 0.0–0.1)
Basophils Relative: 0 % (ref 0–1)
Eosinophils Absolute: 0.1 10*3/uL (ref 0.0–0.7)
Eosinophils Relative: 0 % (ref 0–5)
HCT: 38.8 % (ref 36.0–46.0)
Hemoglobin: 13.6 g/dL (ref 12.0–15.0)
Lymphocytes Relative: 11 % — ABNORMAL LOW (ref 12–46)
Lymphs Abs: 1.6 10*3/uL (ref 0.7–4.0)
MCH: 32.9 pg (ref 26.0–34.0)
MCHC: 35.1 g/dL (ref 30.0–36.0)
MCV: 93.7 fL (ref 78.0–100.0)
Monocytes Absolute: 0.9 10*3/uL (ref 0.1–1.0)
Monocytes Relative: 6 % (ref 3–12)
Neutro Abs: 12.3 10*3/uL — ABNORMAL HIGH (ref 1.7–7.7)
Neutrophils Relative %: 83 % — ABNORMAL HIGH (ref 43–77)
Platelets: 257 10*3/uL (ref 150–400)
RBC: 4.14 MIL/uL (ref 3.87–5.11)
RDW: 12.7 % (ref 11.5–15.5)
WBC: 14.9 10*3/uL — ABNORMAL HIGH (ref 4.0–10.5)

## 2015-03-12 LAB — BASIC METABOLIC PANEL
Anion gap: 6 (ref 5–15)
BUN: 10 mg/dL (ref 6–20)
CO2: 24 mmol/L (ref 22–32)
Calcium: 8.8 mg/dL — ABNORMAL LOW (ref 8.9–10.3)
Chloride: 104 mmol/L (ref 101–111)
Creatinine, Ser: 0.68 mg/dL (ref 0.44–1.00)
GFR calc Af Amer: >60 mL/min (ref >60)
GFR calc non Af Amer: >60 mL/min (ref >60)
Glucose, Bld: 110 mg/dL — ABNORMAL HIGH (ref 65–99)
Potassium: 3.5 mmol/L (ref 3.5–5.1)
Sodium: 134 mmol/L — ABNORMAL LOW (ref 135–145)

## 2015-03-12 MED ORDER — ENOXAPARIN SODIUM 40 MG/0.4ML ~~LOC~~ SOLN
40.0000 mg | SUBCUTANEOUS | Status: DC
  Administered 2015-03-12 – 2015-03-14 (×3): 40 mg via SUBCUTANEOUS
  Filled 2015-03-12 (×3): qty 0.4

## 2015-03-12 MED ORDER — MORPHINE SULFATE 2 MG/ML IJ SOLN
INTRAMUSCULAR | Status: AC
  Filled 2015-03-12: qty 1

## 2015-03-12 MED ORDER — MUPIROCIN 2 % EX OINT
1.0000 | TOPICAL_OINTMENT | Freq: Two times a day (BID) | CUTANEOUS | Status: DC
Start: 2015-03-12 — End: 2015-03-15
  Administered 2015-03-12 – 2015-03-14 (×5): 1 via NASAL
  Filled 2015-03-12: qty 22

## 2015-03-12 MED ORDER — NICOTINE 14 MG/24HR TD PT24
14.0000 mg | MEDICATED_PATCH | Freq: Every day | TRANSDERMAL | Status: DC
  Administered 2015-03-12 – 2015-03-14 (×3): 14 mg via TRANSDERMAL
  Filled 2015-03-12 (×3): qty 1

## 2015-03-12 MED ORDER — CHLORHEXIDINE GLUCONATE CLOTH 2 % EX PADS
6.0000 | MEDICATED_PAD | Freq: Every day | CUTANEOUS | Status: DC
Start: 2015-03-13 — End: 2015-03-15
  Administered 2015-03-13 – 2015-03-15 (×3): 6 via TOPICAL

## 2015-03-12 MED ORDER — ACETAMINOPHEN 325 MG PO TABS
650.0000 mg | ORAL_TABLET | Freq: Four times a day (QID) | ORAL | Status: DC | PRN
  Administered 2015-03-15: 650 mg via ORAL
  Filled 2015-03-12: qty 2

## 2015-03-12 MED ORDER — HYDROCODONE-ACETAMINOPHEN 5-325 MG PO TABS
1.0000 | ORAL_TABLET | Freq: Four times a day (QID) | ORAL | Status: DC | PRN
  Administered 2015-03-12 – 2015-03-14 (×5): 2 via ORAL
  Filled 2015-03-12 (×5): qty 2

## 2015-03-12 MED ORDER — CLINDAMYCIN PHOSPHATE 600 MG/50ML IV SOLN
600.0000 mg | Freq: Three times a day (TID) | INTRAVENOUS | Status: DC
  Administered 2015-03-13 (×2): 600 mg via INTRAVENOUS
  Filled 2015-03-12 (×3): qty 50

## 2015-03-12 MED ORDER — MORPHINE SULFATE 2 MG/ML IJ SOLN
1.0000 mg | INTRAMUSCULAR | Status: DC | PRN
  Administered 2015-03-12 – 2015-03-13 (×6): 1 mg via INTRAVENOUS
  Filled 2015-03-12 (×5): qty 1

## 2015-03-12 MED ORDER — ACETAMINOPHEN 650 MG RE SUPP: 650.0000 mg | Freq: Four times a day (QID) | RECTAL | Status: DC | PRN

## 2015-03-12 MED ORDER — CLINDAMYCIN PHOSPHATE 900 MG/50ML IV SOLN
900.0000 mg | Freq: Once | INTRAVENOUS | Status: AC
  Administered 2015-03-12: 900 mg via INTRAVENOUS
  Filled 2015-03-12: qty 50

## 2015-03-12 NOTE — ED Notes (Signed)
Attempted report Xs 1.  

## 2015-03-12 NOTE — ED Provider Notes (Signed)
CSN: [id number]     Arrival date & time 03/12/15  1508 History  This chart was scribed for non-physician practitioner, Ebbie Ridgehris Stepahnie Campo, PA-C, working with Gilda Creasehristopher J Pollina, MD, by Ronney LionSuzanne Le, ED Scribe. This patient was seen in room TR10C/TR10C and the patient's care was started at 4:28 PM.    Chief Complaint  Patient presents with  . Abscess   The history is provided by the patient. No language interpreter was used.     HPI Comments: [name] is a 24 y.o. female who presents to the Emergency Department complaining of constant, worsening abscess that she first noticed 4 days ago and was incised and drained yesterday. The EDP yesterday also drew a line around the area of redness and swelling and told patient to return if she saw it spread past that line, which it did overnight. She states she also had a positive MRSA test 2 weeks ago. Patient has so far taken 3 doses of Keflex and Bactrim with no relief. She complains of severe, worsening associated pain to the area.    History reviewed. No pertinent past medical history. History reviewed. No pertinent past surgical history. History reviewed. No pertinent family history. History  Substance Use Topics  . Smoking status: Current Every Day Smoker  . Smokeless tobacco: Not on file  . Alcohol Use: No   OB History    No data available     Review of Systems  A complete 10 system review of systems was obtained and all systems are negative except as noted in the HPI and PMH.    Allergies  Review of patient's allergies indicates no known allergies.  Home Medications   Prior to Admission medications   Medication Sig Start Date End Date Taking? Authorizing Provider  cephALEXin (KEFLEX) 500 MG capsule Take 1 capsule (500 mg total) by mouth 4 (four) times daily. 03/11/15   Gwyneth SproutWhitney Plunkett, MD  HYDROcodone-acetaminophen (NORCO/VICODIN) 5-325 MG per tablet Take 1-2 tablets by mouth every 6 (six) hours as needed. 03/11/15   Gwyneth SproutWhitney Plunkett,  MD  sulfamethoxazole-trimethoprim (BACTRIM DS,SEPTRA DS) 800-160 MG per tablet Take 1 tablet by mouth 2 (two) times daily. 03/11/15 03/18/15  Gwyneth SproutWhitney Plunkett, MD   BP 122/81 mmHg  Pulse 112  Temp(Src) 98.6 F (37 C) (Oral)  Resp 20  SpO2 100%  LMP 03/12/2015 Physical Exam  Constitutional: She is oriented to person, place, and time. She appears well-developed and well-nourished. No distress.  HENT:  Head: Normocephalic and atraumatic.  Eyes: Conjunctivae and EOM are normal.  Neck: Neck supple. No tracheal deviation present.  Cardiovascular: Normal rate.   Pulmonary/Chest: Effort normal. No respiratory distress.  Musculoskeletal: Normal range of motion.  Neurological: She is alert and oriented to person, place, and time.  Skin: Skin is warm and dry. There is erythema.  Psychiatric: She has a normal mood and affect. Her behavior is normal.  Nursing note and vitals reviewed.   ED Course  Procedures (including critical care time)  DIAGNOSTIC STUDIES: Oxygen Saturation is 100% on RA, normal by my interpretation.    COORDINATION OF CARE: 4:37 PM - Discussed treatment plan with pt at bedside which includes consulting with attending physician, and pt agreed to plan.   Labs Review Labs Reviewed  CBC WITH DIFFERENTIAL/PLATELET - Abnormal; Notable for the following:    WBC 14.9 (*)    Neutrophils Relative % 83 (*)    Neutro Abs 12.3 (*)    Lymphocytes Relative 11 (*)    All other components  within normal limits  BASIC METABOLIC PANEL - Abnormal; Notable for the following:    Sodium 134 (*)    Glucose, Bld 110 (*)    Calcium 8.8 (*)    All other components within normal limits  WOUND CULTURE   Imaging Review Dg Knee 1-2 Views Right  03/11/2015   CLINICAL DATA:  Right knee swelling, lateral knee abscess and redness  EXAM: RIGHT KNEE - 1-2 VIEW  COMPARISON:  None.  FINDINGS: Two views of the right knee submitted. No acute fracture or subluxation. No joint effusion. No radiopaque  foreign body.  IMPRESSION: Negative.   Electronically Signed   By: Natasha Mead M.D.   On: 03/11/2015 11:52    Patient be admitted to the hospital for further evaluation and care.  Given IV and a box spoke with the [facility] who will admit the patient    Charlestine Night, PA-C 03/16/15 2124  Gilda Crease, MD 03/17/15 1018

## 2015-03-12 NOTE — ED Notes (Signed)
Pt in c/o worsening abscess to right lower leg, seen yesterday for same and had area marked, redness has moved past this and down right shin, reports fatigue and body aches

## 2015-03-12 NOTE — ED Notes (Signed)
Pt aware of bed assignment 

## 2015-03-12 NOTE — ED Notes (Signed)
Attempted report X's 2.  

## 2015-03-12 NOTE — H&P (Addendum)
[facility] History and Physical  [name] NFA:[id number] DOB: 04/16/1991 DOA: 03/12/2015  Referring physician: er PCP: No primary care provider on file.   Chief Complaint:   HPI: [name] is a 24 y.o. female with  PMHX of MRSA abscesses Who was seen in the ER yesterday with right knee redness and swelling x 4 days.  Patient has had several abscesses in the past- found to be MRSA and treated with clindamycin.  She c/o fever, nausea.   Seen in the ER with I&d done- sent on keflex and bactrim.  Area was marked.  X ray done and there was no effusion.  Patient was instructed to come back to the ER if worsens.  The area of redness expanded past the mark and patient returned to the ER.  She has taken her abx- 3 doses without improvement.  In the ER, labs are pending and clindamycin given.  Patient is tachycardic.  She is c/o pain but can move her knee joint.   Hospitalist were asked to admit for pain control and IV abx   Review of Systems:  All systems reviewed, negative unless stated above    History reviewed. No pertinent past medical history. History reviewed. No pertinent past surgical history. Social History:  reports that she has been smoking.  She does not have any smokeless tobacco history on file. She reports that she does not drink alcohol or use illicit drugs.  No Known Allergies  Family History  Problem Relation Age of Onset  . Hypertension       Prior to Admission medications   Medication Sig Start Date End Date Taking? Authorizing Provider  cephALEXin (KEFLEX) 500 MG capsule Take 1 capsule (500 mg total) by mouth 4 (four) times daily. 03/11/15   Gwyneth SproutWhitney Plunkett, MD  HYDROcodone-acetaminophen (NORCO/VICODIN) 5-325 MG per tablet Take 1-2 tablets by mouth every 6 (six) hours as needed. 03/11/15   Gwyneth SproutWhitney Plunkett, MD  sulfamethoxazole-trimethoprim (BACTRIM DS,SEPTRA DS) 800-160 MG per tablet Take 1 tablet by mouth 2 (two) times daily. 03/11/15 03/18/15  Gwyneth SproutWhitney  Plunkett, MD   Physical Exam: Filed Vitals:   03/12/15 1519  BP: 122/81  Pulse: 112  Temp: 98.6 F (37 C)  TempSrc: Oral  Resp: 20  SpO2: 100%    Wt Readings from Last 3 Encounters:  03/11/15 58.968 kg (130 lb)    General:  Appears calm and comfortable Eyes: PERRL, normal lids, irises & conjunctiva ENT: grossly normal hearing, lips & tongue Neck: no LAD, masses or thyromegaly Cardiovascular: RRR, no m/r/g. No LE edema. Telemetry: SR, no arrhythmias  Respiratory: CTA bilaterally, no w/r/r. Normal respiratory effort. Abdomen: soft, ntnd Skin: right knee with large ares (4 inches) with surrounding redness, area draining Musculoskeletal: grossly normal tone BUE/BLE Psychiatric: grossly normal mood and affect, speech fluent and appropriate Neurologic: grossly non-focal.          Labs on Admission:  Basic Metabolic Panel: No results for input(s): NA, K, CL, CO2, GLUCOSE, BUN, CREATININE, CALCIUM, MG, PHOS in the last 168 hours. Liver Function Tests: No results for input(s): AST, ALT, ALKPHOS, BILITOT, PROT, ALBUMIN in the last 168 hours. No results for input(s): LIPASE, AMYLASE in the last 168 hours. No results for input(s): AMMONIA in the last 168 hours. CBC:  Recent Labs Lab 03/12/15 1700  WBC 14.9*  NEUTROABS 12.3*  HGB 13.6  HCT 38.8  MCV 93.7  PLT 257   Cardiac Enzymes: No results for input(s): CKTOTAL, CKMB, CKMBINDEX, TROPONINI in the last 168  hours.  BNP (last 3 results) No results for input(s): BNP in the last 8760 hours.  ProBNP (last 3 results) No results for input(s): PROBNP in the last 8760 hours.  CBG: No results for input(s): GLUCAP in the last 168 hours.  Radiological Exams on Admission: Dg Knee 1-2 Views Right  03/11/2015   CLINICAL DATA:  Right knee swelling, lateral knee abscess and redness  EXAM: RIGHT KNEE - 1-2 VIEW  COMPARISON:  None.  FINDINGS: Two views of the right knee submitted. No acute fracture or subluxation. No joint  effusion. No radiopaque foreign body.  IMPRESSION: Negative.   Electronically Signed   By: Natasha Mead M.D.   On: 03/11/2015 11:52     Assessment/Plan Active Problems:   Cellulitis   Leukocytosis   Fever due to cellulitis- IV abx, does not appear to involve the joint but will need to be monitored for improvement.  Leukocytosis due to cellulitis- IV abx  Pain- PO and IV pain meds ordered  Tobacco abuse -patch   Code Status: full DVT Prophylaxis: Family Communication: patient and family Disposition Plan:   Time spent: 65 min  Marlin Canary [facility] Pager [phone]

## 2015-03-13 LAB — CBC
HCT: 34.3 % — ABNORMAL LOW (ref 36.0–46.0)
Hemoglobin: 11.5 g/dL — ABNORMAL LOW (ref 12.0–15.0)
MCH: 31.4 pg (ref 26.0–34.0)
MCHC: 33.5 g/dL (ref 30.0–36.0)
MCV: 93.7 fL (ref 78.0–100.0)
PLATELETS: 234 10*3/uL (ref 150–400)
RBC: 3.66 MIL/uL — AB (ref 3.87–5.11)
RDW: 12.8 % (ref 11.5–15.5)
WBC: 7.4 10*3/uL (ref 4.0–10.5)

## 2015-03-13 LAB — BASIC METABOLIC PANEL
Anion gap: 7 (ref 5–15)
BUN: 13 mg/dL (ref 6–20)
CO2: 24 mmol/L (ref 22–32)
CREATININE: 0.6 mg/dL (ref 0.44–1.00)
Calcium: 8.6 mg/dL — ABNORMAL LOW (ref 8.9–10.3)
Chloride: 104 mmol/L (ref 101–111)
GFR calc Af Amer: >60 mL/min (ref >60)
GLUCOSE: 93 mg/dL (ref 65–99)
POTASSIUM: 3.6 mmol/L (ref 3.5–5.1)
Sodium: 135 mmol/L (ref 135–145)

## 2015-03-13 LAB — MRSA PCR SCREENING: MRSA BY PCR: NEGATIVE

## 2015-03-13 MED ORDER — PIPERACILLIN-TAZOBACTAM 3.375 G IVPB
3.3750 g | Freq: Three times a day (TID) | INTRAVENOUS | Status: DC
  Administered 2015-03-13 – 2015-03-14 (×4): 3.375 g via INTRAVENOUS
  Filled 2015-03-13 (×7): qty 50

## 2015-03-13 MED ORDER — VANCOMYCIN HCL IN DEXTROSE 750-5 MG/150ML-% IV SOLN
750.0000 mg | Freq: Two times a day (BID) | INTRAVENOUS | Status: DC
  Administered 2015-03-14 – 2015-03-15 (×3): 750 mg via INTRAVENOUS
  Filled 2015-03-13 (×4): qty 150

## 2015-03-13 MED ORDER — VANCOMYCIN HCL 10 G IV SOLR
1250.0000 mg | Freq: Once | INTRAVENOUS | Status: AC
  Administered 2015-03-13: 1250 mg via INTRAVENOUS
  Filled 2015-03-13: qty 1250

## 2015-03-13 NOTE — Progress Notes (Signed)
ANTIBIOTIC CONSULT NOTE - INITIAL  Pharmacy Consult for Vancomycin and Zosyn Indication: wound infection  No Known Allergies  Patient Measurements:   Adjusted Body Weight:   Vital Signs: Temp: 98.7 F (37.1 C) (05/31 0644) BP: 129/75 mmHg (05/31 0644) Pulse Rate: 87 (05/31 0644) Intake/Output from previous day: 05/30 0701 - 05/31 0700 In: 340 [P.O.:240; IV Piggyback:100] Out: 1 [Urine:1] Intake/Output from this shift:    Labs:  Recent Labs  03/12/15 1700 03/13/15 0617  WBC 14.9* 7.4  HGB 13.6 11.5*  PLT 257 234  CREATININE 0.68 0.60   Estimated Creatinine Clearance: 83.3 mL/min (by C-G formula based on Cr of 0.6). No results for input(s): VANCOTROUGH, VANCOPEAK, VANCORANDOM, GENTTROUGH, GENTPEAK, GENTRANDOM, TOBRATROUGH, TOBRAPEAK, TOBRARND, AMIKACINPEAK, AMIKACINTROU, AMIKACIN in the last 72 hours.   Microbiology: Recent Results (from the past 720 hour(s))  Wound culture     Status: None (Preliminary result)   Collection Time: 03/12/15  5:21 PM  Result Value Ref Range Status   Specimen Description WOUND  Final   Special Requests RIGHT LEG  Final   Gram Stain   Final    NO WBC SEEN NO SQUAMOUS EPITHELIAL CELLS SEEN NO ORGANISMS SEEN Performed at [facility]    Culture PENDING  Incomplete   Report Status PENDING  Incomplete  MRSA PCR Screening     Status: None   Collection Time: 03/13/15  8:41 AM  Result Value Ref Range Status   MRSA by PCR NEGATIVE NEGATIVE Final    Comment:        The GeneXpert MRSA Assay (FDA approved for NASAL specimens only), is one component of a comprehensive MRSA colonization surveillance program. It is not intended to diagnose MRSA infection nor to guide or monitor treatment for MRSA infections.     Medical History: History reviewed. No pertinent past medical history.  Medications:  Scheduled:  . Chlorhexidine Gluconate Cloth  6 each Topical Q0600  . enoxaparin (LOVENOX) injection  40 mg Subcutaneous Q24H  .  mupirocin ointment  1 application Nasal BID  . nicotine  14 mg Transdermal Daily  . piperacillin-tazobactam (ZOSYN)  IV  3.375 g Intravenous 3 times per day   Assessment: 24yo female with hx of MRSA abscesses, presenting with knee redness & swelling x 4 days.  She was seen in ED on 5/30 and d/c'd home with Bactrim & Keflex and returned today with worsening redness.  She is to start Vancomycin and Zosyn; no doses have been given.  WBC wnl today, improved from 14.9 yesterday. Cr < 1.  Goal of Therapy:  Vancomycin trough level 10-15 mcg/ml  Plan:  Vancomycin 1250mg  IV x 1, then 750mg  IV q12 Continue Zosyn 3.375g IV q8, 4hr infusion Wound cx pending Watch renal fxn Vanc trough when appropriate  Cynithia Hakimi P 03/13/2015,11:33 AM

## 2015-03-13 NOTE — Progress Notes (Signed)
[facility] PROGRESS NOTE  Rodman Keyvalee Chaviano ZOX:[id number] DOB: 04/07/1991 DOA: 03/12/2015 PCP: No primary care provider on file.  Interim summary Patient is a 24 year old female with a past medical history of MRSA abscesses. Admitted to the medicine service on 03/12/2015 when she presented with complaints of pain and erythema over her right knee. She reported moving old furniture recently at which time she remembers scraping her right knee. She was recently seen at the urgent care center where abscess was lanced and she was treated with Keflex and Bactrim. Despite this intervention she continued to have worsening pain and erythema. She had an x-ray of her right knee done which did not reveal evidence for effusion.  Assessment/Plan: 1. Cellulitis/abscess -Patient reporting scraping her knee about a week ago moving old furniture presenting with worsening erythema, swelling, pain over her right knee. She underwent incision and drainage at urgent care center.  -This was further worked up with plain films of right knee which did not show effusion. -Patient is nontoxic, afebrile and on physical exam I am able to extend and flex her knee, with x-ray not revealing evidence of effusion, I don't think this is a septic joint.  -Will continue empiric IV antimicrobials therapy with vancomycin and Zosyn  2.  Leukocytosis. -Likely secondary to cellulitis/abscess of right knee -Initial labs showing a white count of 14,900, trending down to 7400 on a.m. lab work   Code Status: Full code Family Communication: Spoke with her partner was present at bedside Disposition Plan: Continue empiric IV and microbial therapy   Antibiotics: Vancomycin Zosyn  HPI/Subjective: Patient is nontoxic appearing, reports ongoing pain over her right knee. She states being able to move it somewhat. She is tolerating by mouth intake denies nausea vomiting fevers or chills  Objective: Filed Vitals:   03/13/15 0644  BP:  129/75  Pulse: 87  Temp: 98.7 F (37.1 C)  Resp:     Intake/Output Summary (Last 24 hours) at 03/13/15 1748 Last data filed at 03/13/15 0656  Gross per 24 hour  Intake    340 ml  Output      1 ml  Net    339 ml   There were no vitals filed for this visit.  Exam:   General:  Nontoxic-appearing, she is awake and alert  Cardiovascular: Regular rate and rhythm, normal S1-S2 no murmurs or gallops  Respiratory: Lungs are clear to auscultation bilaterally no wheezing rhonchi rales  Abdomen: Soft nontender nondistended  Musculoskeletal: There is erythema mostly over lateral aspect of right knee that extends superiorly and inferiorly to knee, incision and drainage site lateral to knee, no purulence noted.   Data Reviewed: Basic Metabolic Panel:  Recent Labs Lab 03/12/15 1700 03/13/15 0617  NA 134* 135  K 3.5 3.6  CL 104 104  CO2 24 24  GLUCOSE 110* 93  BUN 10 13  CREATININE 0.68 0.60  CALCIUM 8.8* 8.6*   Liver Function Tests: No results for input(s): AST, ALT, ALKPHOS, BILITOT, PROT, ALBUMIN in the last 168 hours. No results for input(s): LIPASE, AMYLASE in the last 168 hours. No results for input(s): AMMONIA in the last 168 hours. CBC:  Recent Labs Lab 03/12/15 1700 03/13/15 0617  WBC 14.9* 7.4  NEUTROABS 12.3*  --   HGB 13.6 11.5*  HCT 38.8 34.3*  MCV 93.7 93.7  PLT 257 234   Cardiac Enzymes: No results for input(s): CKTOTAL, CKMB, CKMBINDEX, TROPONINI in the last 168 hours. BNP (last 3 results) No results for input(s): BNP  in the last 8760 hours.  ProBNP (last 3 results) No results for input(s): PROBNP in the last 8760 hours.  CBG: No results for input(s): GLUCAP in the last 168 hours.  Recent Results (from the past 240 hour(s))  Wound culture     Status: None (Preliminary result)   Collection Time: 03/12/15  5:21 PM  Result Value Ref Range Status   Specimen Description WOUND  Final   Special Requests RIGHT LEG  Final   Gram Stain   Final     NO WBC SEEN NO SQUAMOUS EPITHELIAL CELLS SEEN NO ORGANISMS SEEN Performed at [facility]    Culture PENDING  Incomplete   Report Status PENDING  Incomplete  MRSA PCR Screening     Status: None   Collection Time: 03/13/15  8:41 AM  Result Value Ref Range Status   MRSA by PCR NEGATIVE NEGATIVE Final    Comment:        The GeneXpert MRSA Assay (FDA approved for NASAL specimens only), is one component of a comprehensive MRSA colonization surveillance program. It is not intended to diagnose MRSA infection nor to guide or monitor treatment for MRSA infections.      Studies: No results found.  Scheduled Meds: . Chlorhexidine Gluconate Cloth  6 each Topical Q0600  . enoxaparin (LOVENOX) injection  40 mg Subcutaneous Q24H  . mupirocin ointment  1 application Nasal BID  . nicotine  14 mg Transdermal Daily  . piperacillin-tazobactam (ZOSYN)  IV  3.375 g Intravenous 3 times per day  . [START ON 03/14/2015] vancomycin  750 mg Intravenous Q12H   Continuous Infusions:   Active Problems:   Cellulitis   Leukocytosis    Time spent: 35 min    Jeralyn Bennett  [facility] Pager [phone]. If 7PM-7AM, please contact night-coverage at [email or web address], password [facility] 03/13/2015, 5:48 PM  LOS: 0 days

## 2015-03-14 DIAGNOSIS — L03115 Cellulitis of right lower limb: Principal | ICD-10-CM

## 2015-03-14 DIAGNOSIS — D72829 Elevated white blood cell count, unspecified: Secondary | ICD-10-CM

## 2015-03-14 LAB — CBC
HEMATOCRIT: 33.8 % — AB (ref 36.0–46.0)
Hemoglobin: 11.2 g/dL — ABNORMAL LOW (ref 12.0–15.0)
MCH: 31.3 pg (ref 26.0–34.0)
MCHC: 33.1 g/dL (ref 30.0–36.0)
MCV: 94.4 fL (ref 78.0–100.0)
Platelets: 238 10*3/uL (ref 150–400)
RBC: 3.58 MIL/uL — ABNORMAL LOW (ref 3.87–5.11)
RDW: 12.6 % (ref 11.5–15.5)
WBC: 4.8 10*3/uL (ref 4.0–10.5)

## 2015-03-14 LAB — BASIC METABOLIC PANEL
ANION GAP: 8 (ref 5–15)
BUN: 7 mg/dL (ref 6–20)
CO2: 24 mmol/L (ref 22–32)
Calcium: 8.6 mg/dL — ABNORMAL LOW (ref 8.9–10.3)
Chloride: 104 mmol/L (ref 101–111)
Creatinine, Ser: 0.64 mg/dL (ref 0.44–1.00)
GFR calc Af Amer: >60 mL/min (ref >60)
Glucose, Bld: 86 mg/dL (ref 65–99)
Potassium: 3.7 mmol/L (ref 3.5–5.1)
SODIUM: 136 mmol/L (ref 135–145)

## 2015-03-14 NOTE — Progress Notes (Addendum)
[facility] Progress Note   [name] YQM:[id number] DOB: 1991/01/08 DOA: 03/12/2015 PCP: No primary care provider on file.  Brief narrative: [name] is a 24 y.o. female with a past medical history of MRSA abscesses. Admitted to the medicine service on 03/12/2015 when she presented with complaints of pain and erythema over her right knee. She reported moving old furniture recently at which time she remembers scraping her right knee. She was recently seen at the urgent care center where abscess was lanced and she was treated with Keflex and Bactrim. Despite this intervention she continued to have worsening pain and erythema. She had an x-ray of her right knee done which did not reveal evidence for effusion.  Assessment/Plan: Cellulitis/abscess- right knee - h/o MRSA abscesses  -Patient reporting scraping her knee about a week ago moving old furniture presenting with worsening erythema, swelling, pain over her right knee-  She underwent incision and drainage at urgent care center.  -This was further worked up with plain films of right knee which did not show effusion. - culture growing staph aureus- d/c Zosyn- cont Vancomycin  Leukocytosis. -Likely secondary to cellulitis/abscess of right knee - improved and now normal  Nicotine abuse - cont Nicotine patch   Code Status: Full code Family Communication: Spoke with her partner was present at bedside Disposition Plan: Continue Vancomycin- await sensitivities- hopefully home tomorrow   Antibiotics: Anti-infectives    Start     Dose/Rate Route Frequency Ordered Stop   03/14/15 0100  vancomycin (VANCOCIN) IVPB 750 mg/150 ml premix     750 mg 150 mL/hr over 60 Minutes Intravenous Every 12 hours 03/13/15 1141     03/13/15 1400  piperacillin-tazobactam (ZOSYN) IVPB 3.375 g     3.375 g 12.5 mL/hr over 240 Minutes Intravenous 3 times per day 03/13/15 1114     03/13/15 1215  vancomycin (VANCOCIN) 1,250 mg in sodium chloride 0.9  % 250 mL IVPB     1,250 mg 166.7 mL/hr over 90 Minutes Intravenous  Once 03/13/15 1141 03/13/15 1345   03/12/15 2359  clindamycin (CLEOCIN) IVPB 600 mg  Status:  Discontinued     600 mg 100 mL/hr over 30 Minutes Intravenous Every 8 hours 03/12/15 2305 03/13/15 1114   03/12/15 1700  clindamycin (CLEOCIN) IVPB 900 mg     900 mg 100 mL/hr over 30 Minutes Intravenous  Once 03/12/15 1659 03/12/15 1753      Antibiotics: Anti-infectives    Start     Dose/Rate Route Frequency Ordered Stop   03/14/15 0100  vancomycin (VANCOCIN) IVPB 750 mg/150 ml premix     750 mg 150 mL/hr over 60 Minutes Intravenous Every 12 hours 03/13/15 1141     03/13/15 1400  piperacillin-tazobactam (ZOSYN) IVPB 3.375 g     3.375 g 12.5 mL/hr over 240 Minutes Intravenous 3 times per day 03/13/15 1114     03/13/15 1215  vancomycin (VANCOCIN) 1,250 mg in sodium chloride 0.9 % 250 mL IVPB     1,250 mg 166.7 mL/hr over 90 Minutes Intravenous  Once 03/13/15 1141 03/13/15 1345   03/12/15 2359  clindamycin (CLEOCIN) IVPB 600 mg  Status:  Discontinued     600 mg 100 mL/hr over 30 Minutes Intravenous Every 8 hours 03/12/15 2305 03/13/15 1114   03/12/15 1700  clindamycin (CLEOCIN) IVPB 900 mg     900 mg 100 mL/hr over 30 Minutes Intravenous  Once 03/12/15 1659 03/12/15 1753      Objective: There were no vitals filed for this  visit.  Intake/Output Summary (Last 24 hours) at 03/14/15 1449 Last data filed at 03/14/15 0653  Gross per 24 hour  Intake    240 ml  Output      0 ml  Net    240 ml     Vitals Filed Vitals:   03/13/15 0644 03/13/15 1915 03/13/15 2146 03/14/15 0705  BP: 129/75 100/57 128/61 97/67  Pulse: 87 84 95 78  Temp: 98.7 F (37.1 C) 98.2 F (36.8 C) 98.2 F (36.8 C) 98.1 F (36.7 C)  TempSrc:  Oral Oral Oral  Resp:  16 16 16   SpO2: 100% 100% 98% 99%    Exam:  General:  Pt is alert, not in acute distress  HEENT: No icterus, No thrush, oral mucosa moist  Cardiovascular: regular rate and  rhythm, S1/S2 No murmur  Respiratory: clear to auscultation bilaterally   Abdomen: Soft, +Bowel sounds, non tender, non distended, no guarding  MSK: No LE edema, cyanosis or clubbing- right knee- mild clear drainage from wound- mild surrounding cellulitis   Data Reviewed: Basic Metabolic Panel:  Recent Labs Lab 03/12/15 1700 03/13/15 0617 03/14/15 0650  NA 134* 135 136  K 3.5 3.6 3.7  CL 104 104 104  CO2 24 24 24   GLUCOSE 110* 93 86  BUN 10 13 7   CREATININE 0.68 0.60 0.64  CALCIUM 8.8* 8.6* 8.6*   Liver Function Tests: No results for input(s): AST, ALT, ALKPHOS, BILITOT, PROT, ALBUMIN in the last 168 hours. No results for input(s): LIPASE, AMYLASE in the last 168 hours. No results for input(s): AMMONIA in the last 168 hours. CBC:  Recent Labs Lab 03/12/15 1700 03/13/15 0617 03/14/15 0650  WBC 14.9* 7.4 4.8  NEUTROABS 12.3*  --   --   HGB 13.6 11.5* 11.2*  HCT 38.8 34.3* 33.8*  MCV 93.7 93.7 94.4  PLT 257 234 238   Cardiac Enzymes: No results for input(s): CKTOTAL, CKMB, CKMBINDEX, TROPONINI in the last 168 hours. BNP (last 3 results) No results for input(s): BNP in the last 8760 hours.  ProBNP (last 3 results) No results for input(s): PROBNP in the last 8760 hours.  CBG: No results for input(s): GLUCAP in the last 168 hours.  Recent Results (from the past 240 hour(s))  Wound culture     Status: None (Preliminary result)   Collection Time: 03/12/15  5:21 PM  Result Value Ref Range Status   Specimen Description WOUND  Final   Special Requests RIGHT LEG  Final   Gram Stain   Final    NO WBC SEEN NO SQUAMOUS EPITHELIAL CELLS SEEN NO ORGANISMS SEEN Performed at [facility]    Culture   Final    MODERATE STAPHYLOCOCCUS AUREUS Note: RIFAMPIN AND GENTAMICIN SHOULD NOT BE USED AS SINGLE DRUGS FOR TREATMENT OF STAPH INFECTIONS. Performed at [facility]    Report Status PENDING  Incomplete  MRSA PCR Screening     Status: None    Collection Time: 03/13/15  8:41 AM  Result Value Ref Range Status   MRSA by PCR NEGATIVE NEGATIVE Final    Comment:        The GeneXpert MRSA Assay (FDA approved for NASAL specimens only), is one component of a comprehensive MRSA colonization surveillance program. It is not intended to diagnose MRSA infection nor to guide or monitor treatment for MRSA infections.      Studies: No results found.  Scheduled Meds:  Scheduled Meds: . Chlorhexidine Gluconate Cloth  6 each Topical Q0600  .  enoxaparin (LOVENOX) injection  40 mg Subcutaneous Q24H  . mupirocin ointment  1 application Nasal BID  . nicotine  14 mg Transdermal Daily  . piperacillin-tazobactam (ZOSYN)  IV  3.375 g Intravenous 3 times per day  . vancomycin  750 mg Intravenous Q12H   Continuous Infusions:   Time spent on care of this patient: 30 min   Virna Livengood, MD 03/14/2015, 2:49 PM  LOS: 1 day   [facility] Office  [phone] Pager - Text Page per [email or web address] If 7PM-7AM, please contact night-coverage [email or web address]

## 2015-03-14 NOTE — Progress Notes (Signed)
Chaplain introduced herself to pt and spouse as they were walking in the hallway. Chaplain got pt a coffee upon pt request. Pt told chaplain what brings her in and about how they are planning to move later this week. Pt and family appreciated conversation with chaplain.   03/14/15 1400  Clinical Encounter Type  Visited With Patient and family together  Visit Type Initial;Spiritual support  Spiritual Encounters  Spiritual Needs Emotional  Stress Factors  Patient Stress Factors Health changes  Yordi Krager, Mayer MaskerCourtney F, Chaplain 03/14/2015 2:23 PM

## 2015-03-15 DIAGNOSIS — L0291 Cutaneous abscess, unspecified: Secondary | ICD-10-CM

## 2015-03-15 DIAGNOSIS — L0390 Cellulitis, unspecified: Secondary | ICD-10-CM

## 2015-03-15 LAB — WOUND CULTURE: Gram Stain: NONE SEEN

## 2015-03-15 MED ORDER — SULFAMETHOXAZOLE-TRIMETHOPRIM 800-160 MG PO TABS: 1.0000 | ORAL_TABLET | Freq: Two times a day (BID) | ORAL | Status: AC

## 2015-03-15 NOTE — Progress Notes (Signed)
Received results for a  Positive wound culture that was positive for MRSA. MD notified. Pt to be d/c on bactrium

## 2015-03-15 NOTE — Discharge Summary (Signed)
Physician Discharge Summary  [name] ZOX:[id number] DOB: 1991/05/18 DOA: 03/12/2015  PCP: No primary care provider on file.  Admit date: 03/12/2015 Discharge date: 03/15/2015  Time spent: 50 minutes    Discharge Condition: stable Diet recommendation: heart healthy, low sodium  Discharge Diagnoses:  Principal Problem:   Cellulitis and abscess Active Problems:   Leukocytosis  Nicotine abuse  History of present illness:  Cellulitis/abscess- right knee - h/o MRSA abscesses  -Patient reporting scraping her knee about a week ago moving old furniture presenting with worsening erythema, swelling, pain over her right knee- She underwent incision and drainage at urgent care center.  -This was further worked up with plain films of right knee which did not show effusion. - culture growing MRSA- have transitioned Vancomycin to Bactrim for 5 more days-   Leukocytosis. -Likely secondary to cellulitis/abscess of right knee - improved and now normal  Nicotine abuse - advised to stop smoking  Discharge Exam: There were no vitals filed for this visit. Filed Vitals:   03/15/15 0546  BP: 101/58  Pulse: 72  Temp: 97.8 F (36.6 C)  Resp: 17    General: AAO x 3, no distress Cardiovascular: RRR, no murmurs  Respiratory: clear to auscultation bilaterally GI: soft, non-tender, non-distended, bowel sound positive  Discharge Instructions You were cared for by a hospitalist during your hospital stay. If you have any questions about your discharge medications or the care you received while you were in the hospital after you are discharged, you can call the unit and asked to speak with the hospitalist on call if the hospitalist that took care of you is not available. Once you are discharged, your primary care physician will handle any further medical issues. Please note that NO REFILLS for any discharge medications will be authorized once you are discharged, as it is imperative that you return  to your primary care physician (or establish a relationship with a primary care physician if you do not have one) for your aftercare needs so that they can reassess your need for medications and monitor your lab values.      Discharge Instructions    Diet - low sodium heart healthy    Complete by:  As directed      Discharge instructions    Complete by:  As directed   Stop Bactrim after evening dose on 6/6.     Increase activity slowly    Complete by:  As directed             Medication List    STOP taking these medications        cephALEXin 500 MG capsule  Commonly known as:  KEFLEX      TAKE these medications        HYDROcodone-acetaminophen 5-325 MG per tablet  Commonly known as:  NORCO/VICODIN  Take 1-2 tablets by mouth every 6 (six) hours as needed.     ibuprofen 200 MG tablet  Commonly known as:  ADVIL,MOTRIN  Take 400 mg by mouth every 6 (six) hours as needed for headache or mild pain.     sulfamethoxazole-trimethoprim 800-160 MG per tablet  Commonly known as:  BACTRIM DS,SEPTRA DS  Take 1 tablet by mouth 2 (two) times daily.       No Known Allergies    The results of significant diagnostics from this hospitalization (including imaging, microbiology, ancillary and laboratory) are listed below for reference.    Significant Diagnostic Studies: Dg Knee 1-2 Views Right  03/11/2015  CLINICAL DATA:  Right knee swelling, lateral knee abscess and redness  EXAM: RIGHT KNEE - 1-2 VIEW  COMPARISON:  None.  FINDINGS: Two views of the right knee submitted. No acute fracture or subluxation. No joint effusion. No radiopaque foreign body.  IMPRESSION: Negative.   Electronically Signed   By: Natasha MeadLiviu  Pop M.D.   On: 03/11/2015 11:52    Microbiology: Recent Results (from the past 240 hour(s))  Wound culture     Status: None   Collection Time: 03/12/15  5:21 PM  Result Value Ref Range Status   Specimen Description WOUND  Final   Special Requests RIGHT LEG  Final   Gram  Stain   Final    NO WBC SEEN NO SQUAMOUS EPITHELIAL CELLS SEEN NO ORGANISMS SEEN Performed at [facility]    Culture   Final    MODERATE METHICILLIN RESISTANT STAPHYLOCOCCUS AUREUS Note: RIFAMPIN AND GENTAMICIN SHOULD NOT BE USED AS SINGLE DRUGS FOR TREATMENT OF STAPH INFECTIONS. This organism DOES NOT demonstrate inducible Clindamycin resistance in vitro. CRITICAL RESULT CALLED TO, READ BACK BY AND VERIFIED WITH: DANIELLE S. AT  9:03AM ON 03/15/15 HAJAM Performed at [facility]    Report Status 03/15/2015 FINAL  Final   Organism ID, Bacteria METHICILLIN RESISTANT STAPHYLOCOCCUS AUREUS  Final      Susceptibility   Methicillin resistant staphylococcus aureus - MIC*    CLINDAMYCIN <=0.25 SENSITIVE Sensitive     ERYTHROMYCIN >=8 RESISTANT Resistant     GENTAMICIN <=0.5 SENSITIVE Sensitive     LEVOFLOXACIN 0.25 SENSITIVE Sensitive     OXACILLIN >=4 RESISTANT Resistant     PENICILLIN >=0.5 RESISTANT Resistant     RIFAMPIN <=0.5 SENSITIVE Sensitive     TRIMETH/SULFA <=10 SENSITIVE Sensitive     VANCOMYCIN 1 SENSITIVE Sensitive     TETRACYCLINE <=1 SENSITIVE Sensitive     * MODERATE METHICILLIN RESISTANT STAPHYLOCOCCUS AUREUS  MRSA PCR Screening     Status: None   Collection Time: 03/13/15  8:41 AM  Result Value Ref Range Status   MRSA by PCR NEGATIVE NEGATIVE Final    Comment:        The GeneXpert MRSA Assay (FDA approved for NASAL specimens only), is one component of a comprehensive MRSA colonization surveillance program. It is not intended to diagnose MRSA infection nor to guide or monitor treatment for MRSA infections.      Labs: Basic Metabolic Panel:  Recent Labs Lab 03/12/15 1700 03/13/15 0617 03/14/15 0650  NA 134* 135 136  K 3.5 3.6 3.7  CL 104 104 104  CO2 24 24 24   GLUCOSE 110* 93 86  BUN 10 13 7   CREATININE 0.68 0.60 0.64  CALCIUM 8.8* 8.6* 8.6*   Liver Function Tests: No results for input(s): AST, ALT, ALKPHOS, BILITOT, PROT,  ALBUMIN in the last 168 hours. No results for input(s): LIPASE, AMYLASE in the last 168 hours. No results for input(s): AMMONIA in the last 168 hours. CBC:  Recent Labs Lab 03/12/15 1700 03/13/15 0617 03/14/15 0650  WBC 14.9* 7.4 4.8  NEUTROABS 12.3*  --   --   HGB 13.6 11.5* 11.2*  HCT 38.8 34.3* 33.8*  MCV 93.7 93.7 94.4  PLT 257 234 238   Cardiac Enzymes: No results for input(s): CKTOTAL, CKMB, CKMBINDEX, TROPONINI in the last 168 hours. BNP: BNP (last 3 results) No results for input(s): BNP in the last 8760 hours.  ProBNP (last 3 results) No results for input(s): PROBNP in the last 8760 hours.  CBG:  No results for input(s): GLUCAP in the last 168 hours.     SignedCalvert Cantor, MD [facility] 03/15/2015, 3:54 PM

## 2016-05-21 ENCOUNTER — Ambulatory Visit (HOSPITAL_COMMUNITY)
Admission: EM | Admit: 2016-05-21 | Discharge: 2016-05-21 | Disposition: A | Discharge status: Home / Self Care | Payer: Self-pay | Attending: Family Medicine | Admitting: Family Medicine

## 2016-05-21 ENCOUNTER — Encounter (HOSPITAL_COMMUNITY): Payer: Self-pay | Admitting: *Deleted

## 2016-05-21 ENCOUNTER — Ambulatory Visit (INDEPENDENT_AMBULATORY_CARE_PROVIDER_SITE_OTHER): Payer: Self-pay

## 2016-05-21 DIAGNOSIS — S92912A Unspecified fracture of left toe(s), initial encounter for closed fracture: Secondary | ICD-10-CM

## 2016-05-21 MED ORDER — HYDROCODONE-ACETAMINOPHEN 5-325 MG PO TABS: 1.0000 | ORAL_TABLET | ORAL | 0 refills | Status: DC | PRN

## 2016-05-21 MED ORDER — HYDROCODONE-ACETAMINOPHEN 5-325 MG PO TABS
ORAL_TABLET | ORAL | Status: AC
  Filled 2016-05-21: qty 1

## 2016-05-21 MED ORDER — HYDROCODONE-ACETAMINOPHEN 5-325 MG PO TABS
1.0000 | ORAL_TABLET | Freq: Once | ORAL | Status: AC
  Administered 2016-05-21: 1 via ORAL

## 2016-05-21 NOTE — ED Provider Notes (Signed)
CSN: [id number]     Arrival date & time 05/21/16  1709 History   First MD Initiated Contact with Patient 05/21/16 1753     Chief Complaint  Patient presents with  . Foot Injury   (Consider location/radiation/quality/duration/timing/severity/associated sxs/prior Treatment) 25 year old female states she dropped a TV onto the left foot. This occurred this morning. She is complaining of pain to the left great toe and along the first metatarsal. Pain with weightbearing and attempts at moving the great toe.      History reviewed. No pertinent past medical history. History reviewed. No pertinent surgical history. Family History  Problem Relation Age of Onset  . Hypertension     Social History  Substance Use Topics  . Smoking status: Current Every Day Smoker  . Smokeless tobacco: Not on file  . Alcohol use No   OB History    No data available     Review of Systems  Constitutional: Negative for activity change, chills and fever.  HENT: Negative.   Respiratory: Negative.   Musculoskeletal: Negative for neck pain and neck stiffness.       As per HPI  Skin: Negative for color change, pallor and rash.  Neurological: Negative.   All other systems reviewed and are negative.   Allergies  Review of patient's allergies indicates no known allergies.  Home Medications   Prior to Admission medications   Medication Sig Start Date End Date Taking? Authorizing Provider  HYDROcodone-acetaminophen (NORCO/VICODIN) 5-325 MG tablet Take 1 tablet by mouth every 4 (four) hours as needed. 05/21/16   Hayden Rasmussen, NP   Meds Ordered and Administered this Visit   Medications  HYDROcodone-acetaminophen (NORCO/VICODIN) 5-325 MG per tablet 1 tablet (1 tablet Oral Given 05/21/16 1836)    BP 120/86 (BP Location: Left Arm)   Pulse 104   Temp 98.2 F (36.8 C) (Oral)   Resp 12   LMP 05/14/2016   SpO2 100%  No data found.   Physical Exam  Constitutional: She is oriented to person, place, and time.  She appears well-developed and well-nourished. No distress.  HENT:  Head: Normocephalic and atraumatic.  Eyes: EOM are normal.  Neck: Normal range of motion. Neck supple.  Cardiovascular: Normal rate.   Pulmonary/Chest: Effort normal.  Musculoskeletal: She exhibits edema and tenderness. She exhibits no deformity.  Mild swelling and ecchymosis to the left great toe and along the first metatarsal. No subungual hematoma seen. Distal sensation is intact. No deformity. Normal warmth. Pedal pulse 2+.  Lymphadenopathy:    She has no cervical adenopathy.  Neurological: She is alert and oriented to person, place, and time. No cranial nerve deficit.  Skin: Skin is warm and dry.  Psychiatric: She has a normal mood and affect.  Nursing note and vitals reviewed.   Urgent Care Course   Clinical Course    Procedures (including critical care time)  Labs Review Labs Reviewed - No data to display  Imaging Review Dg Foot Complete Left  Result Date: 05/21/2016 CLINICAL DATA:  DD fell on foot with pain and swelling, initial encounter EXAM: LEFT FOOT - COMPLETE 3+ VIEW COMPARISON:  None. FINDINGS: There is an undisplaced fracture of the first distal phalanx. Mild soft tissue swelling is noted. No other fractures are seen. IMPRESSION: First distal phalangeal fracture Electronically Signed   By: Alcide Clever M.D.   On: 05/21/2016 19:17     Visual Acuity Review  Right Eye Distance:   Left Eye Distance:   Bilateral Distance:    Right  Eye Near:   Left Eye Near:    Bilateral Near:         MDM   1. Fracture, toe, left, closed, initial encounter    Keep toes buddy taped. Wear the hard sole shoe for at least 2 weeks. Ice, elevation. F/U with the ortho above as needed     Hayden Rasmussenavid Amish Mintzer, NP 05/21/16 1942

## 2016-05-21 NOTE — ED Triage Notes (Signed)
Pt  Dropped a  Heavy  tv  On  Her  l  Big  Toe    She  Has  Bruising  And pain  On the  affected  Toe

## 2016-05-21 NOTE — Discharge Instructions (Signed)
Keep toes buddy taped. Wear the hard sole shoe for at least 2 weeks. Ice, elevation. F/U with the ortho above as needed

## 2016-05-21 NOTE — ED Notes (Signed)
Small  Female  Animal nutritionistWooden   Shoe   And  Buddy  Tape       Applied

## 2016-06-25 ENCOUNTER — Ambulatory Visit (INDEPENDENT_AMBULATORY_CARE_PROVIDER_SITE_OTHER): Payer: Self-pay

## 2016-06-25 ENCOUNTER — Encounter (HOSPITAL_COMMUNITY): Payer: Self-pay | Admitting: Family Medicine

## 2016-06-25 ENCOUNTER — Ambulatory Visit (HOSPITAL_COMMUNITY)
Admission: EM | Admit: 2016-06-25 | Discharge: 2016-06-25 | Disposition: A | Discharge status: Home / Self Care | Payer: Self-pay | Attending: Family Medicine | Admitting: Family Medicine

## 2016-06-25 DIAGNOSIS — J209 Acute bronchitis, unspecified: Secondary | ICD-10-CM

## 2016-06-25 MED ORDER — HYDROCOD POLST-CPM POLST ER 10-8 MG/5ML PO SUER: 5.0000 mL | Freq: Two times a day (BID) | ORAL | 0 refills | Status: AC | PRN

## 2016-06-25 NOTE — ED Provider Notes (Signed)
CSN: [id number]     Arrival date & time 06/25/16  1634 History   First MD Initiated Contact with Patient 06/25/16 1853     Chief Complaint  Patient presents with  . Cough   (Consider location/radiation/quality/duration/timing/severity/associated sxs/prior Treatment) [name] is a well-appearing 25 y.o female, presents today for fever, chills, fatigue, congestion, running nose, sore throat, coughing, feeling somewhat out of breath, wheezing and chest pain when she coughs. Symptoms have been going on for 2 days.  She reports that she coughed up some pink/red-tinged color mixed in sputum this morning. She reports her temperature is 99.8 today. She have tried robitussin and delsym OTC with no relief.      History reviewed. No pertinent past medical history. History reviewed. No pertinent surgical history. Family History  Problem Relation Age of Onset  . Hypertension     Social History  Substance Use Topics  . Smoking status: Current Every Day Smoker  . Smokeless tobacco: Never Used  . Alcohol use No   OB History    No data available     Review of Systems  Constitutional: Positive for chills, fatigue and fever.       Temp was 99.8 today  HENT: Positive for congestion, rhinorrhea and sore throat. Negative for ear pain and sneezing.   Eyes: Negative for pain, redness and visual disturbance.  Respiratory: Positive for cough, shortness of breath and wheezing.        Cough productive, feeling somewhat out of breath, feels like had to take deep breath    Cardiovascular: Negative for chest pain and palpitations.       Chest pain during coughing   Gastrointestinal: Positive for vomiting. Negative for abdominal pain, diarrhea and nausea.  Genitourinary: Negative for dysuria.  Musculoskeletal: Positive for myalgias.  Skin: Negative for rash.  Neurological: Positive for headaches. Negative for dizziness and weakness.    Allergies  Review of patient's allergies indicates no known  allergies.  Home Medications   Prior to Admission medications   Medication Sig Start Date End Date Taking? Authorizing Provider  chlorpheniramine-HYDROcodone (TUSSIONEX PENNKINETIC ER) 10-8 MG/5ML SUER Take 5 mLs by mouth every 12 (twelve) hours as needed for cough. 06/25/16 07/02/16  Lucia EstelleFeng Kaziyah Parkison, NP   Meds Ordered and Administered this Visit  Medications - No data to display  BP 144/94 (BP Location: Right Arm)   Pulse 107   Temp 98.5 F (36.9 C) (Oral)   Resp 18   Ht 4' 11.5" (1.511 m)   Wt 125 lb (56.7 kg)   LMP 06/11/2016 (Exact Date)   SpO2 98%   BMI 24.82 kg/m  No data found.   Physical Exam  Constitutional: She is oriented to person, place, and time. She appears well-developed and well-nourished.  HENT:  Head: Normocephalic and atraumatic.  Eyes: Conjunctivae and EOM are normal. Pupils are equal, round, and reactive to light.  Neck: Normal range of motion. Neck supple.  Cardiovascular: Normal rate, regular rhythm and normal heart sounds.   No murmur heard. Pulmonary/Chest: Effort normal and breath sounds normal. No respiratory distress. She has no wheezes.  Abdominal: Soft. Bowel sounds are normal. There is no tenderness.  Lymphadenopathy:    She has no cervical adenopathy.  Neurological: She is alert and oriented to person, place, and time.  Skin: Skin is warm and dry.  Psychiatric: She has a normal mood and affect.  Nursing note and vitals reviewed.   Urgent Care Course   Clinical Course    Procedures (  including critical care time)  Labs Review Labs Reviewed - No data to display  Imaging Review Dg Chest 2 View  Result Date: 06/25/2016 CLINICAL DATA:  Cough, 2 days duration.  Smoking history. EXAM: CHEST  2 VIEW COMPARISON:  01/15/2015 FINDINGS: Heart size is normal. Mediastinal shadows are normal. There is central bronchial thickening consistent with bronchitis but no infiltrate, collapse or effusion. No significant bone finding. IMPRESSION: Bronchitis  pattern.  No consolidation or collapse. Electronically Signed   By: Paulina Fusi M.D.   On: 06/25/2016 19:23        MDM   1. Acute bronchitis, unspecified organism    Physical examination unremarkable. Chest xray indicates bronchitis. Patient educated on the diagnosis. Informed that antibiotic is not indicated currently. Prescription cough syrup given; take as prescribed. Follow up with primary care doctor if you do not improve. All questions were answered. Discharge instruction given.    Lucia Estelle, NP 06/25/16 1951

## 2016-06-25 NOTE — ED Triage Notes (Signed)
Pt here for cough and congestion x 2 days. sts that she was concerned with coughing up some blood. sts some blood tinged green sputum.

## 2016-08-05 ENCOUNTER — Encounter (HOSPITAL_COMMUNITY): Payer: Self-pay | Admitting: Neurology

## 2016-08-05 ENCOUNTER — Emergency Department (HOSPITAL_COMMUNITY)
Admission: EM | Admit: 2016-08-05 | Discharge: 2016-08-05 | Disposition: A | Discharge status: Home / Self Care | Payer: Self-pay | Attending: Physician Assistant | Admitting: Physician Assistant

## 2016-08-05 DIAGNOSIS — I10 Essential (primary) hypertension: Secondary | ICD-10-CM | POA: Insufficient documentation

## 2016-08-05 DIAGNOSIS — F17200 Nicotine dependence, unspecified, uncomplicated: Secondary | ICD-10-CM | POA: Insufficient documentation

## 2016-08-05 DIAGNOSIS — L0231 Cutaneous abscess of buttock: Secondary | ICD-10-CM | POA: Insufficient documentation

## 2016-08-05 MED ORDER — SULFAMETHOXAZOLE-TRIMETHOPRIM 800-160 MG PO TABS: 2.0000 | ORAL_TABLET | Freq: Two times a day (BID) | ORAL | 0 refills | Status: AC

## 2016-08-05 MED ORDER — HYDROCODONE-ACETAMINOPHEN 5-325 MG PO TABS
1.0000 | ORAL_TABLET | Freq: Four times a day (QID) | ORAL | 0 refills | Status: DC | PRN
Start: 2016-08-05 — End: 2017-11-18

## 2016-08-05 MED ORDER — LIDOCAINE HCL 2 % IJ SOLN
20.0000 mL | Freq: Once | INTRAMUSCULAR | Status: AC
  Administered 2016-08-05: 400 mg
  Filled 2016-08-05: qty 20

## 2016-08-05 NOTE — Discharge Instructions (Signed)
Return here for recheck in 2 days.  Follow-up with your primary care doctor.  Use warm compresses around the area

## 2016-08-05 NOTE — ED Triage Notes (Addendum)
PT reports frequent admissions for treatment  Of abscess .

## 2016-08-05 NOTE — ED Triage Notes (Signed)
Pt here with abscess to right buttocks x 2 days. Is not draining has hx of same. Hurts to sit down.

## 2016-08-05 NOTE — ED Notes (Signed)
Declined W/C at D/C and was escorted to lobby by RN. 

## 2016-08-05 NOTE — ED Provider Notes (Signed)
[facility]-EMERGENCY DEPT Provider Note   CSN: [id number] Arrival date & time: 08/05/16  1301     History   Chief Complaint Chief Complaint  Patient presents with  . Abscess    HPI [name] is a 25 y.o. female.  HPI Patient presents to the emergency department with an abscess to the right mid buttocks.  Patient states the area is been there for the last 3 days.  Patient states she states that movement and palpation make the pain worse.  She states there is been some increased warmth to the area as well. The patient denies chest pain, shortness of breath, headache,blurred vision, neck pain, fever, cough, weakness, numbness, dizziness, anorexia, edema, abdominal pain, nausea, vomiting, diarrhea, rash, back pain, dysuria, hematemesis, bloody stool, near syncope, or syncope. History reviewed. No pertinent past medical history.  Patient Active Problem List   Diagnosis Date Noted  . Cellulitis and abscess 03/15/2015  . Leukocytosis 03/12/2015    History reviewed. No pertinent surgical history.  OB History    No data available       Home Medications    Prior to Admission medications   Not on File    Family History Family History  Problem Relation Age of Onset  . Hypertension      Social History Social History  Substance Use Topics  . Smoking status: Current Every Day Smoker  . Smokeless tobacco: Never Used  . Alcohol use No     Allergies   Review of patient's allergies indicates no known allergies.   Review of Systems Review of Systems  All other systems negative except as documented in the HPI. All pertinent positives and negatives as reviewed in the HPI. Physical Exam Updated Vital Signs BP 122/84 (BP Location: Right Arm)   Pulse 115   Temp 99.1 F (37.3 C) (Oral)   Resp 18   LMP 07/29/2016   SpO2 99%   Physical Exam  Constitutional: She is oriented to person, place, and time. She appears well-developed and well-nourished. No distress.  HENT:    Head: Normocephalic and atraumatic.  Mouth/Throat: Oropharynx is clear and moist.  Eyes: Pupils are equal, round, and reactive to light.  Neck: Normal range of motion. Neck supple.  Cardiovascular: Normal rate, regular rhythm and normal heart sounds.  Exam reveals no gallop and no friction rub.   No murmur heard. Pulmonary/Chest: Effort normal and breath sounds normal. No respiratory distress. She has no wheezes.  Abdominal: Soft. Bowel sounds are normal. She exhibits no distension. There is no tenderness.  Musculoskeletal:       Legs: Neurological: She is alert and oriented to person, place, and time. She exhibits normal muscle tone. Coordination normal.  Skin: Skin is warm and dry. No rash noted. No erythema.  Psychiatric: She has a normal mood and affect. Her behavior is normal.  Nursing note and vitals reviewed.    ED Treatments / Results  Labs (all labs ordered are listed, but only abnormal results are displayed) Labs Reviewed - No data to display  EKG  EKG Interpretation None       Radiology No results found.  Procedures Procedures (including critical care time)  Medications Ordered in ED Medications  lidocaine (XYLOCAINE) 2 % (with pres) injection 400 mg (400 mg Infiltration Given 08/05/16 1449)     Initial Impression / Assessment and Plan / ED Course  I have reviewed the triage vital signs and the nursing notes.  Pertinent labs & imaging results that were available during  my care of the patient were reviewed by me and considered in my medical decision making (see chart for details).  Clinical Course    INCISION AND DRAINAGE Performed by: Carlyle Dolly Consent: Verbal consent obtained. Risks and benefits: risks, benefits and alternatives were discussed Type: abscess  Body area: Right mid buttocks  Anesthesia: local infiltration  Incision was made with a scalpel.  Local anesthetic: lidocaine 2 % without epinephrine  Anesthetic total: 8  ml  Complexity: complex Blunt dissection to break up loculations  Drainage: purulent  Drainage amount: Large   Packing material: 1/4 in iodoform gauze  Patient tolerance: Patient tolerated the procedure well with no immediate complications.  Patient is advised return here 2 days for recheck.  I did advise use heat around the area.  Told to return for any worsening in her condition.  Patient agrees the plan and all questions were answered   Final Clinical Impressions(s) / ED Diagnoses   Final diagnoses:  None    New Prescriptions New Prescriptions   No medications on file     Charlestine Night, PA-C 08/05/16 1541    Charlestine Night, PA-C 08/05/16 1541    Courteney Lyn Mackuen, MD 08/13/16 1506

## 2017-02-26 ENCOUNTER — Emergency Department (HOSPITAL_COMMUNITY)
Admission: EM | Admit: 2017-02-26 | Discharge: 2017-02-26 | Disposition: A | Discharge status: Home / Self Care | Payer: Self-pay | Attending: Emergency Medicine | Admitting: Emergency Medicine

## 2017-02-26 ENCOUNTER — Encounter (HOSPITAL_COMMUNITY): Payer: Self-pay

## 2017-02-26 DIAGNOSIS — F17200 Nicotine dependence, unspecified, uncomplicated: Secondary | ICD-10-CM | POA: Insufficient documentation

## 2017-02-26 DIAGNOSIS — L0201 Cutaneous abscess of face: Secondary | ICD-10-CM | POA: Insufficient documentation

## 2017-02-26 MED ORDER — LIDOCAINE-EPINEPHRINE (PF) 2 %-1:200000 IJ SOLN
10.0000 mL | Freq: Once | INTRAMUSCULAR | Status: AC
  Administered 2017-02-26: 10 mL via INTRADERMAL
  Filled 2017-02-26: qty 20

## 2017-02-26 MED ORDER — SULFAMETHOXAZOLE-TRIMETHOPRIM 800-160 MG PO TABS: 1.0000 | ORAL_TABLET | Freq: Two times a day (BID) | ORAL | 0 refills | Status: DC

## 2017-02-26 NOTE — ED Provider Notes (Signed)
The L side of the forehead with increased swelling and redness over the forehead and down onto the upper brow.  No eye involvement, no fevers.  TTP present  No other findings on exam  Pt with I and D - see attached note - from supervised note -  Bactrim and d/c home Pt well appearing  Medical screening examination/treatment/procedure(s) were conducted as a shared visit with non-physician practitioner(s) and myself.  I personally evaluated the patient during the encounter.  Clinical Impression:   Final diagnoses:  Abscess of face         Eber HongMiller, Jamesyn Moorefield, MD 03/04/17 1753

## 2017-02-26 NOTE — Discharge Instructions (Signed)
Take the full course of your antibiotic as we discussed. This antibiotic can be harmful if you are pregnant. If there is any chance you are pregnant please give us a call. Use the sterile q tips provided to keep the incision site open so this does not occur again. Follow up in the next week for recheck and to discuss if need to be referred to a specialist. You can return if your symptoms worsen.

## 2017-02-26 NOTE — ED Provider Notes (Signed)
[facility] Provider Note   CSN: [id number] Arrival date & time: 02/26/17  1758     History   Chief Complaint Chief Complaint  Patient presents with  . Abscess    HPI [name] is a 26 y.o. female who presents today with abscess on left forehead that began 2 days ago. The patient describes that it started on her hairline as a small bump 2 days ago and has grown in size. There is secondary swelling to her left peri-orbital area and cheek. It is a 7/10 constant pain, that is worse with movement. She has taken old keflex from a previous abscess. She has taken ibuprofen 600mg  x 1 with minimal relief. She was previously healthy prior to this event. No preceding trauma. She denies any changes in her vision, painful eom, tinnitus, hearing changes, fever, chills, drainage.   HPI  History reviewed. No pertinent past medical history.  Patient Active Problem List   Diagnosis Date Noted  . Cellulitis and abscess 03/15/2015  . Leukocytosis 03/12/2015    History reviewed. No pertinent surgical history.  OB History    No data available       Home Medications    Prior to Admission medications   Medication Sig Start Date End Date Taking? Authorizing Provider  HYDROcodone-acetaminophen (NORCO/VICODIN) 5-325 MG tablet Take 1 tablet by mouth every 6 (six) hours as needed for moderate pain. 08/05/16   Lawyer, Cristal Deer, PA-C  sulfamethoxazole-trimethoprim (BACTRIM DS,SEPTRA DS) 800-160 MG tablet Take 1 tablet by mouth 2 (two) times daily. 02/26/17   Cecia Egge, Elmer Sow, PA-C    Family History Family History  Problem Relation Age of Onset  . Hypertension Unknown     Social History Social History  Substance Use Topics  . Smoking status: Current Every Day Smoker  . Smokeless tobacco: Never Used  . Alcohol use No     Allergies   Patient has no known allergies.   Review of Systems Review of Systems  Constitutional: Negative for chills and fever.  HENT: Positive for  facial swelling. Negative for congestion, dental problem, ear discharge, ear pain, hearing loss, rhinorrhea, sinus pain, sinus pressure, sore throat and trouble swallowing.   Eyes: Negative for photophobia, pain, discharge, redness, itching and visual disturbance.     Physical Exam Updated Vital Signs BP 114/80   Pulse 81   Temp 98.4 F (36.9 C) (Oral)   Resp 18   LMP 02/25/2017   SpO2 100%   Physical Exam  Constitutional: She appears well-developed and well-nourished.  HENT:  Head: Normocephalic and atraumatic.    Right Ear: Hearing, tympanic membrane, external ear and ear canal normal.  Left Ear: Hearing, tympanic membrane, external ear and ear canal normal.  Nose: Nose normal. Right sinus exhibits no maxillary sinus tenderness and no frontal sinus tenderness. Left sinus exhibits no maxillary sinus tenderness and no frontal sinus tenderness.  Mouth/Throat: Uvula is midline, oropharynx is clear and moist and mucous membranes are normal.  Secondary swelling to left periorbital border and cheek  Eyes: Conjunctivae and EOM are normal. Pupils are equal, round, and reactive to light. Right eye exhibits no discharge. Left eye exhibits no discharge. Right conjunctiva is not injected. Right conjunctiva has no hemorrhage. Left conjunctiva is not injected. Left conjunctiva has no hemorrhage. No scleral icterus.  Pulmonary/Chest: Effort normal. No respiratory distress.  Neurological: She is alert.  Skin: No pallor.  Psychiatric: She has a normal mood and affect.  Nursing note and vitals reviewed.    ED  Treatments / Results  Labs (all labs ordered are listed, but only abnormal results are displayed) Labs Reviewed - No data to display  EKG  EKG Interpretation None       Radiology No results found.  Procedures .Marland Kitchen.Incision and Drainage Date/Time: 02/26/2017 10:04 PM Performed by: Jacinto HalimMACZIS, Zilah Villaflor M Authorized by: Jacinto HalimMACZIS, Ryen Rhames M   Consent:    Consent obtained:  Verbal    Consent given by:  Patient   Risks discussed:  Bleeding, incomplete drainage and pain   Alternatives discussed:  No treatment Location:    Type:  Abscess   Size:  2 inches   Location:  Head   Head location:  Scalp Pre-procedure details:    Skin preparation:  Betadine Anesthesia (see MAR for exact dosages):    Anesthesia method:  Local infiltration   Local anesthetic:  Lidocaine 2% WITH epi Procedure type:    Complexity:  Simple Procedure details:    Incision types:  Single straight   Incision depth:  Dermal   Scalpel blade:  11   Wound management:  Probed and deloculated and irrigated with saline   Drainage:  Purulent and bloody   Drainage amount:  Scant   Wound treatment:  Wound left open   Packing materials:  None Post-procedure details:    Patient tolerance of procedure:  Tolerated well, no immediate complications Comments:     Patient did well. 2 pea sized amount of white pus drained. Bandaged. Patient did not have any immediate complaints.    (including critical care time)  Medications Ordered in ED Medications  lidocaine-EPINEPHrine (XYLOCAINE W/EPI) 2 %-1:200000 (PF) injection 10 mL (10 mLs Intradermal Given by Other 02/26/17 2130)     Initial Impression / Assessment and Plan / ED Course  I have reviewed the triage vital signs and the nursing notes.  Pertinent labs & imaging results that were available during my care of the patient were reviewed by me and considered in my medical decision making (see chart for details).     Abscess by physical exam, confirmed with ultrasound showing fluid. I&D preformed as written above. I gave the patient a supply of sterile q-tips to use once per day in order to keep the incision site open. She is to follow up with her PCP in the next week for re-check. I prescribed the patient Bactrim for a 10 day course. I discussed with the patient the need to take full course of antibiotics. I discussed with the patient the risk of taking her  antibiotic if pregnant. The patient noted there is "no chance she is pregnant". Discussed with the patient the risk of continued bleeding, incomplete drainage and pain from the procedure. Return precautions were given.   Final Clinical Impressions(s) / ED Diagnoses   Final diagnoses:  Abscess of face    New Prescriptions New Prescriptions   SULFAMETHOXAZOLE-TRIMETHOPRIM (BACTRIM DS,SEPTRA DS) 800-160 MG TABLET    Take 1 tablet by mouth 2 (two) times daily.     Princella PellegriniMaczis, Johnatan Baskette M, PA-C 02/26/17 2217    Eber HongMiller, Brian, MD 03/04/17 [phone]

## 2017-02-26 NOTE — ED Triage Notes (Signed)
Pt presents with abscess to left side of forehead/temporal area of head, onset 2 days ago with mild swelling to left peri-orbital area and cheek.

## 2017-07-07 ENCOUNTER — Encounter (HOSPITAL_COMMUNITY): Payer: Self-pay | Admitting: Emergency Medicine

## 2017-07-07 ENCOUNTER — Emergency Department (HOSPITAL_COMMUNITY): Payer: Self-pay

## 2017-07-07 ENCOUNTER — Emergency Department (HOSPITAL_COMMUNITY)
Admission: EM | Admit: 2017-07-07 | Discharge: 2017-07-07 | Disposition: A | Discharge status: Home / Self Care | Payer: Self-pay | Attending: Emergency Medicine | Admitting: Emergency Medicine

## 2017-07-07 DIAGNOSIS — N760 Acute vaginitis: Secondary | ICD-10-CM | POA: Insufficient documentation

## 2017-07-07 DIAGNOSIS — F17200 Nicotine dependence, unspecified, uncomplicated: Secondary | ICD-10-CM | POA: Insufficient documentation

## 2017-07-07 DIAGNOSIS — Z79899 Other long term (current) drug therapy: Secondary | ICD-10-CM | POA: Insufficient documentation

## 2017-07-07 LAB — URINALYSIS, ROUTINE W REFLEX MICROSCOPIC
BILIRUBIN URINE: NEGATIVE
GLUCOSE, UA: NEGATIVE mg/dL
Ketones, ur: NEGATIVE mg/dL
Leukocytes, UA: NEGATIVE
NITRITE: NEGATIVE
PH: 7 (ref 5.0–8.0)
Protein, ur: NEGATIVE mg/dL
Specific Gravity, Urine: 1.005 (ref 1.005–1.030)
Squamous Epithelial / LPF: NONE SEEN

## 2017-07-07 LAB — CBC
HCT: 42.7 % (ref 36.0–46.0)
Hemoglobin: 14.3 g/dL (ref 12.0–15.0)
MCH: 32.1 pg (ref 26.0–34.0)
MCHC: 33.5 g/dL (ref 30.0–36.0)
MCV: 95.7 fL (ref 78.0–100.0)
PLATELETS: 262 10*3/uL (ref 150–400)
RBC: 4.46 MIL/uL (ref 3.87–5.11)
RDW: 12.2 % (ref 11.5–15.5)
WBC: 6.9 10*3/uL (ref 4.0–10.5)

## 2017-07-07 LAB — COMPREHENSIVE METABOLIC PANEL
ALT: 12 U/L — ABNORMAL LOW (ref 14–54)
AST: 18 U/L (ref 15–41)
Albumin: 4 g/dL (ref 3.5–5.0)
Alkaline Phosphatase: 74 U/L (ref 38–126)
Anion gap: 9 (ref 5–15)
BILIRUBIN TOTAL: 0.7 mg/dL (ref 0.3–1.2)
BUN: 9 mg/dL (ref 6–20)
CALCIUM: 9.3 mg/dL (ref 8.9–10.3)
CO2: 20 mmol/L — ABNORMAL LOW (ref 22–32)
Chloride: 107 mmol/L (ref 101–111)
Creatinine, Ser: 0.72 mg/dL (ref 0.44–1.00)
GFR calc Af Amer: >60 mL/min (ref >60)
Glucose, Bld: 83 mg/dL (ref 65–99)
POTASSIUM: 3.8 mmol/L (ref 3.5–5.1)
Sodium: 136 mmol/L (ref 135–145)
TOTAL PROTEIN: 6.8 g/dL (ref 6.5–8.1)

## 2017-07-07 LAB — WET PREP, GENITAL
Clue Cells Wet Prep HPF POC: NONE SEEN
Sperm: NONE SEEN
Trich, Wet Prep: NONE SEEN
Yeast Wet Prep HPF POC: NONE SEEN

## 2017-07-07 LAB — LIPASE, BLOOD: Lipase: 31 U/L (ref 11–51)

## 2017-07-07 LAB — PREGNANCY, URINE: PREG TEST UR: NEGATIVE

## 2017-07-07 LAB — I-STAT BETA HCG BLOOD, ED (MC, WL, AP ONLY): I-stat hCG, quantitative: 5.2 m[IU]/mL — ABNORMAL HIGH (ref <5)

## 2017-07-07 MED ORDER — DOXYCYCLINE HYCLATE 100 MG PO CAPS: 100.0000 mg | ORAL_CAPSULE | Freq: Two times a day (BID) | ORAL | 0 refills | Status: DC

## 2017-07-07 MED ORDER — MORPHINE SULFATE (PF) 4 MG/ML IV SOLN
2.0000 mg | Freq: Once | INTRAVENOUS | Status: AC
  Administered 2017-07-07: 2 mg via INTRAVENOUS
  Filled 2017-07-07: qty 1

## 2017-07-07 MED ORDER — SODIUM CHLORIDE 0.9 % IV BOLUS (SEPSIS)
1000.0000 mL | Freq: Once | INTRAVENOUS | Status: AC
  Administered 2017-07-07: 1000 mL via INTRAVENOUS

## 2017-07-07 MED ORDER — CEFTRIAXONE SODIUM 250 MG IJ SOLR
250.0000 mg | Freq: Once | INTRAMUSCULAR | Status: AC
  Administered 2017-07-07: 250 mg via INTRAMUSCULAR
  Filled 2017-07-07: qty 250

## 2017-07-07 MED ORDER — ONDANSETRON HCL 4 MG/2ML IJ SOLN
4.0000 mg | Freq: Once | INTRAMUSCULAR | Status: AC
  Administered 2017-07-07: 4 mg via INTRAVENOUS
  Filled 2017-07-07: qty 2

## 2017-07-07 MED ORDER — STERILE WATER FOR INJECTION IJ SOLN
INTRAMUSCULAR | Status: AC
Start: 2017-07-07 — End: 2017-07-07
  Administered 2017-07-07: 10 mL
  Filled 2017-07-07: qty 10

## 2017-07-07 MED ORDER — AZITHROMYCIN 250 MG PO TABS
1000.0000 mg | ORAL_TABLET | Freq: Once | ORAL | Status: AC
  Administered 2017-07-07: 1000 mg via ORAL
  Filled 2017-07-07: qty 4

## 2017-07-07 NOTE — ED Notes (Signed)
Pt assisted to restroom. Pt stated she would try and give a urine sample.

## 2017-07-07 NOTE — ED Notes (Signed)
Family at bedside. Pt ambulatory, stable, and verbalizes understanding of discharge instructions.

## 2017-07-07 NOTE — ED Triage Notes (Signed)
Pt states she is having abd pain and R sided pain. Pt has not been able to "keep anything down" since last week. Denies diarrhea. Last urination was 0300 with no discomfort. Denies any pregnancy. LMP was last week Wednesday.

## 2017-07-07 NOTE — ED Notes (Signed)
Pelvic cart set up 

## 2017-07-07 NOTE — ED Notes (Signed)
Pt ambulated to room from waiting area. Pt had complaints of pain in her lower back. Pt stated she could not go to the restroom at this time. Pt placed on bp and o2 monitor.

## 2017-07-07 NOTE — ED Provider Notes (Signed)
[facility] Provider Note   CSN: [id number] Arrival date & time: 07/07/17  0805     History   Chief Complaint Chief Complaint  Patient presents with  . Abdominal Pain    HPI [name] is a 26 y.o. female.  HPI   [name] has a PMH of cellulitis, abscess, leukocytosis and ovarian cysts when she was in her early teens. She developed abdominal pain last Thursday and had vomiting with it. Since then she says she has been unable to keep anything down. Denies n/v/d, fevers, dysuria or vaginal symptoms. She is married. Her pain is right flank and left lower quadrant.    History reviewed. No pertinent past medical history.  Patient Active Problem List   Diagnosis Date Noted  . Cellulitis and abscess 03/15/2015  . Leukocytosis 03/12/2015    History reviewed. No pertinent surgical history.  OB History    No data available       Home Medications    Prior to Admission medications   Medication Sig Start Date End Date Taking? Authorizing Provider  alum & mag hydroxide-simeth (MAALOX/MYLANTA) 200-200-20 MG/5ML suspension Take 15 mLs by mouth every 6 (six) hours as needed for indigestion or heartburn.   Yes [provider]  bismuth subsalicylate (PEPTO BISMOL) 262 MG/15ML suspension Take 30 mLs by mouth every 6 (six) hours as needed for indigestion.   Yes [provider]  esomeprazole (NEXIUM) 20 MG capsule Take 20 mg by mouth daily at 12 noon.   Yes [provider]  HYDROcodone-acetaminophen (NORCO/VICODIN) 5-325 MG tablet Take 1 tablet by mouth every 6 (six) hours as needed for moderate pain. Patient not taking: Reported on 07/07/2017 08/05/16   Charlestine Night, PA-C  sulfamethoxazole-trimethoprim (BACTRIM DS,SEPTRA DS) 800-160 MG tablet Take 1 tablet by mouth 2 (two) times daily. Patient not taking: Reported on 07/07/2017 02/26/17   Maczis, Elmer Sow, PA-C    Family History Family History  Problem Relation Age of Onset  .  Hypertension Unknown     Social History Social History  Substance Use Topics  . Smoking status: Current Every Day Smoker  . Smokeless tobacco: Never Used  . Alcohol use No     Allergies   Patient has no known allergies.   Review of Systems Review of Systems  Negative ROS aside from pertinent positives and negatives as listed in HPI  Physical Exam Updated Vital Signs BP 119/81   Pulse 79   Temp 98 F (36.7 C) (Oral)   Resp 18   Ht  (1.499 m)   LMP 07/01/2017 (Exact Date)   SpO2 100%   Physical Exam  Constitutional: She appears well-developed and well-nourished. No distress.  HENT:  Head: Normocephalic and atraumatic.  Right Ear: Tympanic membrane and ear canal normal.  Left Ear: Tympanic membrane and ear canal normal.  Nose: Nose normal.  Mouth/Throat: Uvula is midline, oropharynx is clear and moist and mucous membranes are normal.  Eyes: Pupils are equal, round, and reactive to light.  Neck: Normal range of motion. Neck supple.  Cardiovascular: Normal rate and regular rhythm.   Pulmonary/Chest: Effort normal.  Abdominal: Soft. Normal appearance and bowel sounds are normal. She exhibits no mass. There is tenderness in the left lower quadrant. There is CVA tenderness (right). There is no rigidity, no rebound and no guarding.  No signs of abdominal distention Mild diffuse tenderness  Genitourinary: Cervix exhibits no motion tenderness and no discharge. Right adnexum displays no mass and no tenderness. Left adnexum displays  no mass and no tenderness. No tenderness or bleeding in the vagina. No foreign body in the vagina. No vaginal discharge found.  Musculoskeletal:  No LE swelling  Neurological: She is alert.  Acting at baseline  Skin: Skin is warm and dry. No rash noted.  Nursing note and vitals reviewed.    ED Treatments / Results  Labs (all labs ordered are listed, but only abnormal results are displayed) Labs Reviewed  WET PREP, GENITAL - Abnormal;  Notable for the following:       Result Value   WBC, Wet Prep HPF POC MODERATE (*)    All other components within normal limits  COMPREHENSIVE METABOLIC PANEL - Abnormal; Notable for the following:    CO2 20 (*)    ALT 12 (*)    All other components within normal limits  URINALYSIS, ROUTINE W REFLEX MICROSCOPIC - Abnormal; Notable for the following:    Color, Urine STRAW (*)    Hgb urine dipstick SMALL (*)    Bacteria, UA RARE (*)    All other components within normal limits  I-STAT BETA HCG BLOOD, ED ([facility], [facility], [facility] ONLY) - Abnormal; Notable for the following:    I-stat hCG, quantitative 5.2 (*)    All other components within normal limits  LIPASE, BLOOD  CBC  PREGNANCY, URINE  GC/CHLAMYDIA PROBE AMP ([facility] [place]) NOT AT [facility]    EKG  EKG Interpretation None       Radiology Ct Abdomen Pelvis Wo Contrast  Result Date: 07/07/2017 CLINICAL DATA:  Flank pain with stone disease suspected. Flank pain is right-sided. EXAM: CT ABDOMEN AND PELVIS WITHOUT CONTRAST TECHNIQUE: Multidetector CT imaging of the abdomen and pelvis was performed following the standard protocol without IV contrast. COMPARISON:  None. FINDINGS: Lower chest:  Negative. Hepatobiliary: No focal liver abnormality.No evidence of biliary obstruction or stone. Pancreas: Unremarkable. Spleen: Unremarkable. Adrenals/Urinary Tract: Negative adrenals. No hydronephrosis or stone. Unremarkable bladder. Stomach/Bowel:  No obstruction. No appendicitis. Vascular/Lymphatic: No acute vascular abnormality. No mass or adenopathy. Reproductive:No pathologic findings. Other: No ascites or pneumoperitoneum. Musculoskeletal: No acute abnormalities. IMPRESSION: Negative exam.  No explanation for symptoms. Electronically Signed   By: Marnee Spring M.D.   On: 07/07/2017 13:59    Procedures Procedures (including critical care time)  Medications Ordered in ED Medications  cefTRIAXone (ROCEPHIN) injection 250 mg (not administered)    azithromycin (ZITHROMAX) tablet 1,000 mg (not administered)  sodium chloride 0.9 % bolus 1,000 mL (0 mLs Intravenous Stopped 07/07/17 1131)  ondansetron (ZOFRAN) injection 4 mg (4 mg Intravenous Given 07/07/17 1041)  morphine 4 MG/ML injection 2 mg (2 mg Intravenous Given 07/07/17 1131)     Initial Impression / Assessment and Plan / ED Course  I have reviewed the triage vital signs and the nursing notes.  Pertinent labs & imaging results that were available during my care of the patient were reviewed by me and considered in my medical decision making (see chart for details).    11:07 am Negative pregnancy test, negative for UTI. Lipase, CBC and CMP are unremarkable.  Will do pelvic exam and order imaging accordingly. She is being given IV fluids and nausea medication.  [name] CT scan of the abdomen and pelvis is unremarkable. Her Wet prep showed moderate WBC's. I will treat with 250 mg IM Rocephin and Azithromycin. GC cultures will be pending. Will rx Zofran and Tramadol for pain. She needs to follow-up with [facility]  Blood pressure 119/81, pulse 79, temperature 98 F (36.7 C),  temperature source Oral, resp. rate 18, height  (1.499 m), last menstrual period 07/01/2017, SpO2 100 %.  [name] has been evaluated today in the emergency department. The appropriate screening and testing was been performed and I believe the patient to be medically stable for discharge.   Return signs and symptoms have been discussed with the patient and/or caregivers and they have voiced their understanding. The patient has agreed to follow-up with their primary care provider or the referred specialist.    Final Clinical Impressions(s) / ED Diagnoses   Final diagnoses:  Acute vaginitis    New Prescriptions Current Discharge Medication List       Marlon Pel, PA-C 07/07/17 1443    Eudelia Bunch Amadeo Garnet, MD 07/09/17 [phone]

## 2017-07-08 LAB — GC/CHLAMYDIA PROBE AMP (~~LOC~~) NOT AT ARMC
Chlamydia: NEGATIVE
NEISSERIA GONORRHEA: NEGATIVE

## 2017-11-18 ENCOUNTER — Ambulatory Visit (HOSPITAL_COMMUNITY)
Admission: EM | Admit: 2017-11-18 | Discharge: 2017-11-18 | Disposition: A | Discharge status: Home / Self Care | Payer: Self-pay | Attending: Family Medicine | Admitting: Family Medicine

## 2017-11-18 ENCOUNTER — Other Ambulatory Visit: Payer: Self-pay

## 2017-11-18 ENCOUNTER — Encounter (HOSPITAL_COMMUNITY): Payer: Self-pay | Admitting: Emergency Medicine

## 2017-11-18 DIAGNOSIS — L0291 Cutaneous abscess, unspecified: Secondary | ICD-10-CM

## 2017-11-18 HISTORY — DX: Carrier or suspected carrier of methicillin resistant Staphylococcus aureus: Z22.322

## 2017-11-18 MED ORDER — HYDROCODONE-ACETAMINOPHEN 5-325 MG PO TABS: 1.0000 | ORAL_TABLET | Freq: Four times a day (QID) | ORAL | 0 refills | Status: DC | PRN

## 2017-11-18 MED ORDER — SULFAMETHOXAZOLE-TRIMETHOPRIM 800-160 MG PO TABS: 1.0000 | ORAL_TABLET | Freq: Two times a day (BID) | ORAL | 0 refills | Status: AC

## 2017-11-18 NOTE — ED Triage Notes (Signed)
Pt. Stated, I have an abscess growing since yesterday.

## 2017-11-18 NOTE — Discharge Instructions (Signed)
Be aware, pain medications may cause drowsiness. Please do not drive, operate heavy machinery or make important decisions while on this medication, it can cloud your judgement. ° °You have had an abscess drained today and you may have had packing placed in the wound to help the abscess continue to drain at home. If packing was placed, please do not remove it. You may shower with the packing in place. Let the soapy water clean your wound. Do not scrub it. Keep your wound covered. Follow up at the Urgent Care in 2 days for a wound check and/or packing removal. Return to the Urgent Care immediately if you develop any of the following symptoms: fever, Increased redness or swelling around where your abscess was, increased pain, or generalized weakness or vomiting. ° °

## 2017-11-19 NOTE — ED Provider Notes (Signed)
  [facility]   [id number] 11/18/17 Arrival Time: 1753  ASSESSMENT & PLAN:  1. Abscess     Meds ordered this encounter  Medications  . HYDROcodone-acetaminophen (NORCO/VICODIN) 5-325 MG tablet    Sig: Take 1 tablet by mouth every 6 (six) hours as needed for moderate pain or severe pain.    Dispense:  8 tablet    Refill:  0  . sulfamethoxazole-trimethoprim (BACTRIM DS,SEPTRA DS) 800-160 MG tablet    Sig: Take 1 tablet by mouth 2 (two) times daily for 10 days.    Dispense:  20 tablet    Refill:  0    Procedure: Verbal consent obtained. Area over induration cleaned with betadine. Lidocaine 2% without epinephrine used to obtain local anesthesia. The most fluctuant portion of the abscess was incised with a #11 blade scalpel. Copious purulent drainage. Abscess cavity explored and evacuated. Loculations broken up with a curved hemostat as best as possible given patient discomfort. Cavity packed with packing material and dressed with a clean gauze dressing. Minimal bleeding. No complications.  Wound care instructions discussed and given in written format. To return in 48 hours for wound check and packing removal.  Finish all antibiotics. OTC analgesics as needed. Pain medication sedation precautions.  Reviewed expectations re: course of current medical issues. Questions answered. Outlined signs and symptoms indicating need for more acute intervention. Patient verbalized understanding. After Visit Summary given.   SUBJECTIVE:  [name] is a 27 y.o. female who presents with a possible abscess of her lower R abdomen. Onset gradual, approximately 2 days ago. Painful. No drainage. Afebrile. No OTC treatment. H/O similar; MRSA. No n/v.  ROS: As per HPI.  OBJECTIVE:  Vitals:   11/18/17 1854  BP: 123/79  Pulse: (!) 107  Resp: 16  Temp: 98.6 F (37 C)  TempSrc: Oral  SpO2: 100%  Weight: 125 lb (56.7 kg)  Height: 4' 11.5" (1.511 m)     General appearance: alert; no  distress Skin: 1x2 cm induration of her R lower abdomen; tender to touch; no active drainage Psychological: alert and cooperative; normal mood and affect  No Known Allergies  Past Medical History:  Diagnosis Date  . MRSA (methicillin resistant staph aureus) culture positive    Social History   Socioeconomic History  . Marital status: Single    Spouse name: None  . Number of children: None  . Years of education: None  . Highest education level: None  Social Needs  . Financial resource strain: None  . Food insecurity - worry: None  . Food insecurity - inability: None  . Transportation needs - medical: None  . Transportation needs - non-medical: None  Occupational History  . None  Tobacco Use  . Smoking status: Current Every Day Smoker  . Smokeless tobacco: Never Used  Substance and Sexual Activity  . Alcohol use: No  . Drug use: No  . Sexual activity: Yes    Birth control/protection: None  Other Topics Concern  . None  Social History Narrative  . None   Family History  Problem Relation Age of Onset  . Hypertension Unknown    History reviewed. No pertinent surgical history.         Mardella LaymanHagler, Lean Jaeger, MD 11/19/17 1000

## 2017-11-20 ENCOUNTER — Encounter (HOSPITAL_COMMUNITY): Payer: Self-pay | Admitting: Emergency Medicine

## 2017-11-20 ENCOUNTER — Ambulatory Visit (HOSPITAL_COMMUNITY)
Admission: EM | Admit: 2017-11-20 | Discharge: 2017-11-20 | Disposition: A | Discharge status: Home / Self Care | Payer: Self-pay | Attending: Internal Medicine | Admitting: Internal Medicine

## 2017-11-20 DIAGNOSIS — Z5189 Encounter for other specified aftercare: Secondary | ICD-10-CM

## 2017-11-20 DIAGNOSIS — L0291 Cutaneous abscess, unspecified: Secondary | ICD-10-CM

## 2017-11-20 NOTE — ED Provider Notes (Signed)
[facility]    CSN: [id number] Arrival date & time: 11/20/17  [id number]     History   Chief Complaint Chief Complaint  Patient presents with  . Abscess    HPI [name] is a 27 y.o. female.   27 year old female, presenting today due to wound recheck.  Patient was seen 2 days ago and had an incision and drainage performed to an abscess in the right lower quadrant of her abdomen.  Patient states that she has had burning around the packing.  States that she did start her antibiotics yesterday.  She has had no fever or chills.  Complains of continued purulent drainage from the wound.   The history is provided by the patient.  Wound Check  This is a new problem. The current episode started 2 days ago. The problem occurs constantly. The problem has not changed since onset.Pertinent negatives include no chest pain, no abdominal pain, no headaches and no shortness of breath. Nothing aggravates the symptoms. Nothing relieves the symptoms. Treatments tried: oral antibiotics and pain meds. The treatment provided moderate relief.    Past Medical History:  Diagnosis Date  . MRSA (methicillin resistant staph aureus) culture positive     Patient Active Problem List   Diagnosis Date Noted  . Cellulitis and abscess 03/15/2015  . Leukocytosis 03/12/2015    History reviewed. No pertinent surgical history.  OB History    No data available       Home Medications    Prior to Admission medications   Medication Sig Start Date End Date Taking? Authorizing Provider  HYDROcodone-acetaminophen (NORCO/VICODIN) 5-325 MG tablet Take 1 tablet by mouth every 6 (six) hours as needed for moderate pain or severe pain. 11/18/17  Yes Mardella LaymanHagler, Brian, MD  sulfamethoxazole-trimethoprim (BACTRIM DS,SEPTRA DS) 800-160 MG tablet Take 1 tablet by mouth 2 (two) times daily for 10 days. 11/18/17 11/28/17 Yes Mardella LaymanHagler, Brian, MD  alum & mag hydroxide-simeth (MAALOX/MYLANTA) 200-200-20 MG/5ML suspension Take 15  mLs by mouth every 6 (six) hours as needed for indigestion or heartburn.    [provider]  bismuth subsalicylate (PEPTO BISMOL) 262 MG/15ML suspension Take 30 mLs by mouth every 6 (six) hours as needed for indigestion.    [provider]  esomeprazole (NEXIUM) 20 MG capsule Take 20 mg by mouth daily at 12 noon.    [provider]    Family History Family History  Problem Relation Age of Onset  . Hypertension Unknown     Social History Social History   Tobacco Use  . Smoking status: Current Every Day Smoker  . Smokeless tobacco: Never Used  Substance Use Topics  . Alcohol use: No  . Drug use: No     Allergies   Patient has no known allergies.   Review of Systems Review of Systems  Constitutional: Negative for chills and fever.  HENT: Negative for ear pain and sore throat.   Eyes: Negative for pain and visual disturbance.  Respiratory: Negative for cough and shortness of breath.   Cardiovascular: Negative for chest pain and palpitations.  Gastrointestinal: Negative for abdominal pain and vomiting.  Genitourinary: Negative for dysuria and hematuria.  Musculoskeletal: Negative for arthralgias and back pain.  Skin: Positive for wound (RLQ ). Negative for color change and rash.  Neurological: Negative for seizures, syncope and headaches.  All other systems reviewed and are negative.    Physical Exam Triage Vital Signs ED Triage Vitals  Enc Vitals Group     BP 11/20/17 1007  111/84     Pulse Rate 11/20/17 1007 99     Resp 11/20/17 1007 18     Temp 11/20/17 1007 97.9 F (36.6 C)     Temp Source 11/20/17 1007 Oral     SpO2 11/20/17 1007 97 %     Weight --      Height --      Head Circumference --      Peak Flow --      Pain Score 11/20/17 1008 8     Pain Loc --      Pain Edu? --      Excl. in GC? --    No data found.  Updated Vital Signs BP 111/84 (BP Location: Left Arm)   Pulse 99   Temp 97.9 F (36.6 C) (Oral)   Resp 18   LMP  11/15/2017   SpO2 97%   Visual Acuity Right Eye Distance:   Left Eye Distance:   Bilateral Distance:    Right Eye Near:   Left Eye Near:    Bilateral Near:     Physical Exam  Constitutional: She appears well-developed and well-nourished. No distress.  HENT:  Head: Normocephalic and atraumatic.  Eyes: Conjunctivae are normal.  Neck: Neck supple.  Cardiovascular: Normal rate and regular rhythm.  No murmur heard. Pulmonary/Chest: Effort normal and breath sounds normal. No respiratory distress.  Abdominal: Soft. There is no tenderness.  Musculoskeletal: She exhibits no edema.  Neurological: She is alert.  Skin: Skin is warm and dry.     Healing abscess to the right lower quadrant of the abdomen.  There is very mild amount of surrounding erythema.  Packing in place.  Packing was removed.  Psychiatric: She has a normal mood and affect.  Nursing note and vitals reviewed.    UC Treatments / Results  Labs (all labs ordered are listed, but only abnormal results are displayed) Labs Reviewed - No data to display  EKG  EKG Interpretation None       Radiology No results found.  Procedures Procedures (including critical care time)  Medications Ordered in UC Medications - No data to display   Initial Impression / Assessment and Plan / UC Course  I have reviewed the triage vital signs and the nursing notes.  Pertinent labs & imaging results that were available during my care of the patient were reviewed by me and considered in my medical decision making (see chart for details).     Patient here today for wound check.  Seen here 2 days ago and had an incision and drainage of her abdomen performed.  Packing was removed.  No evidence of infection.  Recommended continued use of antibiotics and warm compresses.  Given return precautions  Final Clinical Impressions(s) / UC Diagnoses   Final diagnoses:  Wound check, abscess    ED Discharge Orders    None        Controlled Substance Prescriptions [place] Controlled Substance Registry consulted? Not Applicable   Alecia Lemming, [place] 11/20/17 1018

## 2017-11-20 NOTE — ED Triage Notes (Signed)
PT C/O: pt is here for a f/u on abscess.... Seen here on 11/18/17   Sts sx are getting worse.... Sx include burning and drainage.   TAKING MEDS: Taking antibiotics given here  A&O x4... NAD... Ambulatory

## 2018-01-18 ENCOUNTER — Ambulatory Visit (HOSPITAL_COMMUNITY)
Admission: EM | Admit: 2018-01-18 | Discharge: 2018-01-18 | Disposition: A | Discharge status: Home / Self Care | Payer: Self-pay | Attending: Family Medicine | Admitting: Family Medicine

## 2018-01-18 ENCOUNTER — Encounter (HOSPITAL_COMMUNITY): Payer: Self-pay | Admitting: Emergency Medicine

## 2018-01-18 DIAGNOSIS — F17210 Nicotine dependence, cigarettes, uncomplicated: Secondary | ICD-10-CM | POA: Insufficient documentation

## 2018-01-18 DIAGNOSIS — J029 Acute pharyngitis, unspecified: Secondary | ICD-10-CM | POA: Insufficient documentation

## 2018-01-18 DIAGNOSIS — Z79899 Other long term (current) drug therapy: Secondary | ICD-10-CM | POA: Insufficient documentation

## 2018-01-18 LAB — POCT RAPID STREP A: Streptococcus, Group A Screen (Direct): NEGATIVE

## 2018-01-18 MED ORDER — AMOXICILLIN 500 MG PO CAPS: 500.0000 mg | ORAL_CAPSULE | Freq: Two times a day (BID) | ORAL | 0 refills | Status: AC

## 2018-01-18 MED ORDER — FLUTICASONE PROPIONATE 50 MCG/ACT NA SUSP: 2.0000 | Freq: Every day | NASAL | 0 refills | Status: DC

## 2018-01-18 MED ORDER — CETIRIZINE-PSEUDOEPHEDRINE ER 5-120 MG PO TB12: 1.0000 | ORAL_TABLET | Freq: Every day | ORAL | 0 refills | Status: DC

## 2018-01-18 MED ORDER — IPRATROPIUM BROMIDE 0.06 % NA SOLN: 2.0000 | Freq: Four times a day (QID) | NASAL | 0 refills | Status: DC

## 2018-01-18 NOTE — ED Provider Notes (Signed)
[facility]    CSN: [id number] Arrival date & time: 01/18/18  [id number]     History   Chief Complaint Chief Complaint  Patient presents with  . Sore Throat    HPI [name] is a 27 y.o. female.   27 year old female comes in for 3 day history of URI symptoms. Has had nasal congestion, bilateral ear pain, rhinorrhea, sore throat, body aches. Fever, Tmax 102, tylenol/aleve, last dose last night. otc cold medicine without relief. Current every day smoker, 13 years, 1ppd. Denies chest pain, shortness of breath, wheezing.      Past Medical History:  Diagnosis Date  . MRSA (methicillin resistant staph aureus) culture positive     Patient Active Problem List   Diagnosis Date Noted  . Cellulitis and abscess 03/15/2015  . Leukocytosis 03/12/2015    History reviewed. No pertinent surgical history.  OB History   None      Home Medications    Prior to Admission medications   Medication Sig Start Date End Date Taking? Authorizing Provider  alum & mag hydroxide-simeth (MAALOX/MYLANTA) 200-200-20 MG/5ML suspension Take 15 mLs by mouth every 6 (six) hours as needed for indigestion or heartburn.    [provider]  amoxicillin (AMOXIL) 500 MG capsule Take 1 capsule (500 mg total) by mouth 2 (two) times daily for 10 days. 01/18/18 01/28/18  Cathie HoopsYu, Lasya Vetter V, PA-C  bismuth subsalicylate (PEPTO BISMOL) 262 MG/15ML suspension Take 30 mLs by mouth every 6 (six) hours as needed for indigestion.    [provider]  cetirizine-pseudoephedrine (ZYRTEC-D) 5-120 MG tablet Take 1 tablet by mouth daily. 01/18/18   Cathie HoopsYu, Deanndra Kirley V, PA-C  esomeprazole (NEXIUM) 20 MG capsule Take 20 mg by mouth daily at 12 noon.    [provider]  fluticasone (FLONASE) 50 MCG/ACT nasal spray Place 2 sprays into both nostrils daily. 01/18/18   Belinda FisherYu, Lizmary Nader V, PA-C  HYDROcodone-acetaminophen (NORCO/VICODIN) 5-325 MG tablet Take 1 tablet by mouth every 6 (six) hours as needed for moderate pain or severe  pain. 11/18/17   Mardella LaymanHagler, Brian, MD  ipratropium (ATROVENT) 0.06 % nasal spray Place 2 sprays into both nostrils 4 (four) times daily. 01/18/18   Belinda FisherYu, Jahni Nazar V, PA-C    Family History Family History  Problem Relation Age of Onset  . Hypertension Unknown     Social History Social History   Tobacco Use  . Smoking status: Current Every Day Smoker  . Smokeless tobacco: Never Used  Substance Use Topics  . Alcohol use: No  . Drug use: No     Allergies   Patient has no known allergies.   Review of Systems Review of Systems  Reason unable to perform ROS: See HPI as above.     Physical Exam Triage Vital Signs ED Triage Vitals [01/18/18 1007]  Enc Vitals Group     BP 125/85     Pulse Rate (!) 109     Resp (!) 22     Temp (!) 97.2 F (36.2 C)     Temp Source Oral     SpO2 99 %     Weight      Height      Head Circumference      Peak Flow      Pain Score      Pain Loc      Pain Edu?      Excl. in GC?    No data found.  Updated Vital Signs BP 125/85 (BP Location:  Left Arm)   Pulse (!) 109   Temp (!) 97.2 F (36.2 C) (Oral)   Resp (!) 22   SpO2 99%   Physical Exam  Constitutional: She is oriented to person, place, and time. She appears well-developed and well-nourished. No distress.  HENT:  Head: Normocephalic and atraumatic.  Right Ear: Tympanic membrane, external ear and ear canal normal. Tympanic membrane is not erythematous and not bulging.  Left Ear: Tympanic membrane, external ear and ear canal normal. Tympanic membrane is not erythematous and not bulging.  Nose: Rhinorrhea present. Right sinus exhibits no maxillary sinus tenderness and no frontal sinus tenderness. Left sinus exhibits no maxillary sinus tenderness and no frontal sinus tenderness.  Mouth/Throat: Uvula is midline and mucous membranes are normal. Posterior oropharyngeal erythema present. Tonsils are 2+ on the right. Tonsils are 2+ on the left. Tonsillar exudate.  Eyes: Pupils are equal, round, and  reactive to light. Conjunctivae are normal.  Neck: Normal range of motion. Neck supple.  Cardiovascular: Normal rate, regular rhythm and normal heart sounds. Exam reveals no gallop and no friction rub.  No murmur heard. Pulmonary/Chest: Effort normal and breath sounds normal. She has no decreased breath sounds. She has no wheezes. She has no rhonchi. She has no rales.  Lymphadenopathy:    She has no cervical adenopathy.  Neurological: She is alert and oriented to person, place, and time.  Skin: Skin is warm and dry.  Psychiatric: She has a normal mood and affect. Her behavior is normal. Judgment normal.     UC Treatments / Results  Labs (all labs ordered are listed, but only abnormal results are displayed) Labs Reviewed  CULTURE, GROUP A STREP [facility])  POCT RAPID STREP A    EKG None Radiology No results found.  Procedures Procedures (including critical care time)  Medications Ordered in UC Medications - No data to display   Initial Impression / Assessment and Plan / UC Course  I have reviewed the triage vital signs and the nursing notes.  Pertinent labs & imaging results that were available during my care of the patient were reviewed by me and considered in my medical decision making (see chart for details).    Rapid strep negative. Patient nontoxic in appearance, speaking in full sentences without acute distress. However, given history and exam, will treat empirically for tonsillitis with amoxicillin. Discussed with patient this still could be due to viral illness, and in that case, abx will not help with symptoms. Other symptomatic treatment discussed. Push fluid. Return precautions given. Patient expresses understanding and agrees to plan.   Final Clinical Impressions(s) / UC Diagnoses   Final diagnoses:  Pharyngitis, unspecified etiology    ED Discharge Orders        Ordered    amoxicillin (AMOXIL) 500 MG capsule  2 times daily     01/18/18 1023    fluticasone  (FLONASE) 50 MCG/ACT nasal spray  Daily     01/18/18 1023    ipratropium (ATROVENT) 0.06 % nasal spray  4 times daily     01/18/18 1023    cetirizine-pseudoephedrine (ZYRTEC-D) 5-120 MG tablet  Daily     01/18/18 1023        Belinda Fisher, PA-C 01/18/18 1029

## 2018-01-18 NOTE — Discharge Instructions (Signed)
Rapid strep negative. However, as discussed, given your exam, will treat you empirically for strep. Start amoxicillin as directed. As discussed, this could still be virus that is causing symptoms, and antibiotics will not treat for that. Start flonase, atrovent nasal spray, zyrtec-D for nasal congestion/drainage. You can use over the counter nasal saline rinse such as neti pot for nasal congestion. Keep hydrated, your urine should be clear to pale yellow in color. Tylenol/motrin for fever and pain. Monitor for any worsening of symptoms, chest pain, shortness of breath, wheezing, swelling of the throat, follow up for reevaluation.   For sore throat try using a honey-based tea. Use 3 teaspoons of honey with juice squeezed from half lemon. Place shaved pieces of ginger into 1/2-1 cup of water and warm over stove top. Then mix the ingredients and repeat every 4 hours as needed.

## 2018-01-18 NOTE — ED Triage Notes (Signed)
Pt here for sore throat

## 2018-01-20 LAB — CULTURE, GROUP A STREP (THRC)

## 2018-03-31 ENCOUNTER — Ambulatory Visit (HOSPITAL_COMMUNITY)
Admission: EM | Admit: 2018-03-31 | Discharge: 2018-03-31 | Disposition: A | Discharge status: Home / Self Care | Payer: Self-pay | Attending: Internal Medicine | Admitting: Internal Medicine

## 2018-03-31 ENCOUNTER — Encounter (HOSPITAL_COMMUNITY): Payer: Self-pay | Admitting: Family Medicine

## 2018-03-31 DIAGNOSIS — R0789 Other chest pain: Secondary | ICD-10-CM

## 2018-03-31 MED ORDER — MELOXICAM 15 MG PO TABS: 15.0000 mg | ORAL_TABLET | Freq: Every day | ORAL | 0 refills | Status: DC

## 2018-03-31 MED ORDER — TRAMADOL HCL 50 MG PO TABS: 50.0000 mg | ORAL_TABLET | Freq: Four times a day (QID) | ORAL | 0 refills | Status: DC | PRN

## 2018-03-31 NOTE — ED Triage Notes (Signed)
Pt here for pain and mass under the left breast. She reports that it has been there for at least a year but worsened yesterday. The pain radiates into left breast.

## 2018-03-31 NOTE — Discharge Instructions (Addendum)
Please take Mobic and tramadol as needed for pain.  Please call and establish care with primary care provider to further evaluate soft tissue discomfort.  If any fevers, increasing pain, swelling or redness please return to the clinic.

## 2018-03-31 NOTE — ED Provider Notes (Signed)
[facility]    CSN: [id number] Arrival date & time: 03/31/18  1739     History   Chief Complaint No chief complaint on file.   HPI [name] is a 27 y.o. female.   Presents to the urgent care facility for evaluation of soft tissue mass in her left breast.  Patient states is been there for 1 year but yesterday became more painful.  She has had intermittent pain in this area in the past but has not had any evaluation.  She denies any warmth redness or drainage.  She is been taking Aleve 2 tablets twice a day with mild relief.  She denies any trauma or injury.  No chest pain or shortness of breath.  Her pain is only increased with touch.  HPI  Past Medical History:  Diagnosis Date  . MRSA (methicillin resistant staph aureus) culture positive     Patient Active Problem List   Diagnosis Date Noted  . Cellulitis and abscess 03/15/2015  . Leukocytosis 03/12/2015    History reviewed. No pertinent surgical history.  OB History   None      Home Medications    Prior to Admission medications   Medication Sig Start Date End Date Taking? Authorizing Provider  alum & mag hydroxide-simeth (MAALOX/MYLANTA) 200-200-20 MG/5ML suspension Take 15 mLs by mouth every 6 (six) hours as needed for indigestion or heartburn.    [provider]  bismuth subsalicylate (PEPTO BISMOL) 262 MG/15ML suspension Take 30 mLs by mouth every 6 (six) hours as needed for indigestion.    [provider]  cetirizine-pseudoephedrine (ZYRTEC-D) 5-120 MG tablet Take 1 tablet by mouth daily. 01/18/18   Cathie HoopsYu, Amy V, PA-C  esomeprazole (NEXIUM) 20 MG capsule Take 20 mg by mouth daily at 12 noon.    [provider]  fluticasone (FLONASE) 50 MCG/ACT nasal spray Place 2 sprays into both nostrils daily. 01/18/18   Cathie HoopsYu, Amy V, PA-C  ipratropium (ATROVENT) 0.06 % nasal spray Place 2 sprays into both nostrils 4 (four) times daily. 01/18/18   Belinda FisherYu, Amy V, PA-C  meloxicam (MOBIC) 15 MG tablet  Take 1 tablet (15 mg total) by mouth daily. 03/31/18   Evon SlackGaines, Thomas C, PA-C  traMADol (ULTRAM) 50 MG tablet Take 1 tablet (50 mg total) by mouth every 6 (six) hours as needed. 03/31/18   Evon SlackGaines, Thomas C, PA-C    Family History Family History  Problem Relation Age of Onset  . Hypertension Unknown     Social History Social History   Tobacco Use  . Smoking status: Current Every Day Smoker  . Smokeless tobacco: Never Used  Substance Use Topics  . Alcohol use: No  . Drug use: No     Allergies   Patient has no known allergies.   Review of Systems Review of Systems  Musculoskeletal: Negative for arthralgias and back pain.  Skin: Negative for color change, rash and wound.     Physical Exam Triage Vital Signs ED Triage Vitals  Enc Vitals Group     BP 03/31/18 1755 123/89     Pulse Rate 03/31/18 1755 91     Resp 03/31/18 1755 18     Temp 03/31/18 1755 98.4 F (36.9 C)     Temp Source 03/31/18 1755 Oral     SpO2 03/31/18 1755 100 %     Weight --      Height --      Head Circumference --      Peak Flow --  Pain Score 03/31/18 1754 7     Pain Loc --      Pain Edu? --      Excl. in GC? --    No data found.  Updated Vital Signs BP 123/89   Pulse 91   Temp 98.4 F (36.9 C) (Oral)   Resp 18   LMP 03/24/2018   SpO2 100%   Visual Acuity Right Eye Distance:   Left Eye Distance:   Bilateral Distance:    Right Eye Near:   Left Eye Near:    Bilateral Near:     Physical Exam  Constitutional: She is oriented to person, place, and time. She appears well-developed and well-nourished.  HENT:  Head: Normocephalic and atraumatic.  Eyes: Conjunctivae are normal.  Neck: Normal range of motion.  Cardiovascular: Normal rate.  Pulmonary/Chest: Effort normal. No stridor. No respiratory distress. She has no wheezes. She has no rales.  Musculoskeletal: Normal range of motion.  Neurological: She is alert and oriented to person, place, and time.  Skin: Skin is warm. No  rash noted.  Just below the left breast along the chest wall there is some fibrous fatty tissue present that is slightly tender with no fluctuance, warmth or redness.  Tissue is slightly firm and mobile.  There does not appear to be any underlying chest wall tenderness or bruising.  Ribs are nontender to palpation.  Capital soft nontender nondistended.    Psychiatric: She has a normal mood and affect. Her behavior is normal. Thought content normal.     UC Treatments / Results  Labs (all labs ordered are listed, but only abnormal results are displayed) Labs Reviewed - No data to display  EKG None  Radiology No results found.  Procedures Procedures (including critical care time)  Medications Ordered in UC Medications - No data to display  Initial Impression / Assessment and Plan / UC Course  I have reviewed the triage vital signs and the nursing notes.  Pertinent labs & imaging results that were available during my care of the patient were reviewed by me and considered in my medical decision making (see chart for details).     27 year old female with soft tissue tenderness below the left breast.  There is no signs of any infection.  Patient states she has had soft tissue mass here for 1 year but recently became painful.  Patient was given a prescription for meloxicam and tramadol and she is encouraged to follow-up with primary care provider for further evaluation and imaging.  She is educated on signs symptoms to return to clinic for. Final Clinical Impressions(s) / UC Diagnoses   Final diagnoses:  Anterior chest wall pain     Discharge Instructions     Please take Mobic and tramadol as needed for pain.  Please call and establish care with primary care provider to further evaluate soft tissue discomfort.  If any fevers, increasing pain, swelling or redness please return to the clinic.   ED Prescriptions    Medication Sig Dispense Auth. Provider   traMADol (ULTRAM) 50 MG  tablet Take 1 tablet (50 mg total) by mouth every 6 (six) hours as needed. 20 tablet Evon Slack, PA-C   meloxicam (MOBIC) 15 MG tablet Take 1 tablet (15 mg total) by mouth daily. 30 tablet Ronnette Juniper       Evon Slack, [place] 03/31/18 1851

## 2018-04-15 ENCOUNTER — Encounter (HOSPITAL_COMMUNITY): Payer: Self-pay | Admitting: Emergency Medicine

## 2018-04-15 ENCOUNTER — Emergency Department (HOSPITAL_COMMUNITY)
Admission: EM | Admit: 2018-04-15 | Discharge: 2018-04-15 | Disposition: A | Discharge status: Home / Self Care | Payer: Self-pay | Attending: Emergency Medicine | Admitting: Emergency Medicine

## 2018-04-15 DIAGNOSIS — F17210 Nicotine dependence, cigarettes, uncomplicated: Secondary | ICD-10-CM | POA: Insufficient documentation

## 2018-04-15 DIAGNOSIS — K0889 Other specified disorders of teeth and supporting structures: Secondary | ICD-10-CM | POA: Insufficient documentation

## 2018-04-15 DIAGNOSIS — Z79899 Other long term (current) drug therapy: Secondary | ICD-10-CM | POA: Insufficient documentation

## 2018-04-15 MED ORDER — PENICILLIN V POTASSIUM 250 MG PO TABS
500.0000 mg | ORAL_TABLET | Freq: Once | ORAL | Status: AC
  Administered 2018-04-15: 500 mg via ORAL
  Filled 2018-04-15: qty 2

## 2018-04-15 MED ORDER — PENICILLIN V POTASSIUM 500 MG PO TABS: 500.0000 mg | ORAL_TABLET | Freq: Four times a day (QID) | ORAL | 0 refills | Status: AC

## 2018-04-15 NOTE — Discharge Instructions (Addendum)
Take the antibiotics as directed.  Return to ED for any worsening symptoms including trouble breathing or trouble swallowing, chest pain, coughing up blood, high fevers or trouble moving her neck. Take your pain medicine as needed.

## 2018-04-15 NOTE — ED Triage Notes (Signed)
Pt to ER for left facial/dental pain onset yesterday. Unable to eat due to pain.

## 2018-04-15 NOTE — ED Provider Notes (Signed)
[facility] EMERGENCY DEPARTMENT Provider Note   CSN: [id number] Arrival date & time: 04/15/18  4098     History   Chief Complaint Chief Complaint  Patient presents with  . Dental Pain    HPI [name] is a 27 y.o. female who presents to ED for evaluation of 1 day history of left lower dental pain.  States that she sometimes can feel her wisdom teeth emerging.  She saw a dentist several years ago but was told it would take at least $800 to get her wisdom teeth extracted.  She cannot afford this because she has 3 children.  She has been taking tramadol and an anti-inflammatory prescribed by another provider for a breast problem.  States that this is helping her pain.  She denies any changes in voice, trismus, drooling, drainage or bleeding from site, trouble moving neck, fever, rashes, shortness of breath.  HPI  Past Medical History:  Diagnosis Date  . MRSA (methicillin resistant staph aureus) culture positive     Patient Active Problem List   Diagnosis Date Noted  . Cellulitis and abscess 03/15/2015  . Leukocytosis 03/12/2015    History reviewed. No pertinent surgical history.   OB History   None      Home Medications    Prior to Admission medications   Medication Sig Start Date End Date Taking? Authorizing Provider  alum & mag hydroxide-simeth (MAALOX/MYLANTA) 200-200-20 MG/5ML suspension Take 15 mLs by mouth every 6 (six) hours as needed for indigestion or heartburn.    [provider]  bismuth subsalicylate (PEPTO BISMOL) 262 MG/15ML suspension Take 30 mLs by mouth every 6 (six) hours as needed for indigestion.    [provider]  cetirizine-pseudoephedrine (ZYRTEC-D) 5-120 MG tablet Take 1 tablet by mouth daily. 01/18/18   Cathie Hoops, Amy V, PA-C  esomeprazole (NEXIUM) 20 MG capsule Take 20 mg by mouth daily at 12 noon.    [provider]  fluticasone (FLONASE) 50 MCG/ACT nasal spray Place 2 sprays into both nostrils daily.  01/18/18   Cathie Hoops, Amy V, PA-C  ipratropium (ATROVENT) 0.06 % nasal spray Place 2 sprays into both nostrils 4 (four) times daily. 01/18/18   Belinda Fisher, PA-C  meloxicam (MOBIC) 15 MG tablet Take 1 tablet (15 mg total) by mouth daily. 03/31/18   Evon Slack, PA-C  penicillin v potassium (VEETID) 500 MG tablet Take 1 tablet (500 mg total) by mouth 4 (four) times daily for 7 days. 04/15/18 04/22/18  Ova Gillentine, PA-C  traMADol (ULTRAM) 50 MG tablet Take 1 tablet (50 mg total) by mouth every 6 (six) hours as needed. 03/31/18   Evon Slack, PA-C    Family History Family History  Problem Relation Age of Onset  . Hypertension Unknown     Social History Social History   Tobacco Use  . Smoking status: Current Every Day Smoker  . Smokeless tobacco: Never Used  Substance Use Topics  . Alcohol use: No  . Drug use: No     Allergies   Patient has no known allergies.   Review of Systems Review of Systems  Constitutional: Negative for chills and fever.  HENT: Positive for dental problem. Negative for drooling, facial swelling, postnasal drip, rhinorrhea, trouble swallowing and voice change.   Respiratory: Negative for shortness of breath.   Musculoskeletal: Negative for neck pain.  Skin: Negative for color change.     Physical Exam Updated Vital Signs BP (!) 130/92 (BP Location: Right Arm)  Pulse 91   Temp 98.3 F (36.8 C) (Oral)   Resp 18   LMP 03/24/2018   SpO2 100%   Physical Exam  Constitutional: She appears well-developed and well-nourished. No distress.  HENT:  Head: Normocephalic and atraumatic.  Mouth/Throat: Uvula is midline and oropharynx is clear and moist. She does not have dentures. No oral lesions. No trismus in the jaw. Abnormal dentition. Dental caries present. No dental abscesses, uvula swelling or lacerations.    Tenderness to palpation of the left lower wisdom tooth.  No gross dental abscess or site of drainage at this time.  No induration or fluctuance noted.  No facial, neck or cheek swelling noted. No pooling of secretions or trismus.  Normal voice noted with no difficulty swallowing or breathing. No submandibular edema, erythema or crepitus noted.  Eyes: Conjunctivae and EOM are normal. No scleral icterus.  Neck: Normal range of motion.  Pulmonary/Chest: Effort normal. No respiratory distress.  Neurological: She is alert.  Skin: No rash noted. She is not diaphoretic.  Psychiatric: She has a normal mood and affect.  Nursing note and vitals reviewed.    ED Treatments / Results  Labs (all labs ordered are listed, but only abnormal results are displayed) Labs Reviewed - No data to display  EKG None  Radiology No results found.  Procedures Procedures (including critical care time)  Medications Ordered in ED Medications  penicillin v potassium (VEETID) tablet 500 mg (has no administration in time range)     Initial Impression / Assessment and Plan / ED Course  I have reviewed the triage vital signs and the nursing notes.  Pertinent labs & imaging results that were available during my care of the patient were reviewed by me and considered in my medical decision making (see chart for details).     Patient with dentalgia. On exam, there is no evidence of a drainable abscess. No trismus, glossal elevation, unilateral tonsillar swelling. No evidence of retropharyngeal or peritonsillar abscess or Ludwig angina. Will treat with penicillin.   Patient has tramadol and anti-inflammatories as prescribed by another provider for breast pain.  Advised to continue taking these as needed for her dental pain as well.  Pt instructed to follow-up with dentist as soon as possible. Resource guide provided with AVS.  Portions of this note were generated with Scientist, clinical (histocompatibility and immunogenetics)Dragon dictation software. Dictation errors may occur despite best attempts at proofreading.   Final Clinical Impressions(s) / ED Diagnoses   Final diagnoses:  Pain, dental    ED Discharge  Orders        Ordered    penicillin v potassium (VEETID) 500 MG tablet  4 times daily     04/15/18 0907       Dietrich PatesKhatri, Letecia Arps, PA-C 04/15/18 0911    Tilden Fossaees, Elizabeth, MD 04/16/18 1538

## 2018-06-08 ENCOUNTER — Other Ambulatory Visit: Payer: Self-pay

## 2018-06-08 ENCOUNTER — Emergency Department (HOSPITAL_COMMUNITY)
Admission: EM | Admit: 2018-06-08 | Discharge: 2018-06-08 | Disposition: A | Discharge status: Home / Self Care | Payer: Self-pay | Attending: Emergency Medicine | Admitting: Emergency Medicine

## 2018-06-08 ENCOUNTER — Encounter (HOSPITAL_COMMUNITY): Payer: Self-pay

## 2018-06-08 DIAGNOSIS — N6320 Unspecified lump in the left breast, unspecified quadrant: Secondary | ICD-10-CM | POA: Insufficient documentation

## 2018-06-08 DIAGNOSIS — F17210 Nicotine dependence, cigarettes, uncomplicated: Secondary | ICD-10-CM | POA: Insufficient documentation

## 2018-06-08 LAB — POC URINE PREG, ED: Preg Test, Ur: NEGATIVE

## 2018-06-08 MED ORDER — KETOROLAC TROMETHAMINE 30 MG/ML IJ SOLN
30.0000 mg | Freq: Once | INTRAMUSCULAR | Status: AC
  Administered 2018-06-08: 30 mg via INTRAMUSCULAR
  Filled 2018-06-08: qty 1

## 2018-06-08 MED ORDER — NAPROXEN 375 MG PO TABS: 375.0000 mg | ORAL_TABLET | Freq: Two times a day (BID) | ORAL | 0 refills | Status: DC

## 2018-06-08 MED FILL — NAPROXEN 375 MG TABLET: 375 | 10 days supply | Qty: 20 | Fill #0

## 2018-06-08 NOTE — Care Management Note (Addendum)
Case Management Note  CM consulted for no pcp and no ins with need for follow up.  CM spoke with pt who requested help with getting a follow up appointment with no time restrictions.  CM was unable to get an appointment for pt via phone.  [facility] possible [facility] cancellation openings.  Information for clinics, financial counseling, and pharmacy placed on AVS.  Updated [place] ScrapeMurray, [place].  No further CM needs noted at this time.  Marisella Puccio, Lynnae SandhoffAngela N, RN 06/08/2018, 12:26 PM

## 2018-06-08 NOTE — Discharge Instructions (Signed)
Please see the information and instructions below regarding your visit.  Your diagnoses today include:  1. Left breast mass    Your exam is reassuring today.  You will need follow-up with possible ultrasound and biopsy, and this can be done when she establish care with that information listed in the paperwork.  After financial counseling, the ultimate goal is to get [place] [facility] [place].  Tests performed today include: See side panel of your discharge paperwork for testing performed today. Vital signs are listed at the bottom of these instructions.   Medications prescribed:    Take any prescribed medications only as prescribed, and any over the counter medications only as directed on the packaging.  You are prescribed naproxen, a non-steroidal anti-inflammatory agent (NSAID) for pain. You may take 375 mg every 12 hours as needed for pain. If still requiring this medication around the clock for acute pain after 10 days, please see your primary healthcare provider.  Women who are pregnant, breastfeeding, or planning on becoming pregnant should not take non-steroidal anti-inflammatories such as Advil and Aleve. Tylenol is a safe over the counter pain reliever in pregnant women.  You may combine this medication with Tylenol, 650 mg every 6 hours, so you are receiving something for pain every 3 hours.  This is not a long-term medication unless under the care and direction of your primary provider. Taking this medication long-term and not under the supervision of a healthcare provider could increase the risk of stomach ulcers, kidney problems, and cardiovascular problems such as high blood pressure.    Home care instructions:  Please follow any educational materials contained in this packet.   Follow-up instructions: Please follow-up with your primary care provider per instructions from [place], RN for further evaluation of your symptoms if they are not completely  improved.  She is working on making appointment for you.  She will contact you.  Return instructions:  Please return to the Emergency Department if you experience worsening symptoms.  Please return to the emergency department if you develop any worsening pain, redness of the breast, or drainage. Please return if you have any other emergent concerns.  Additional Information:   Your vital signs today were: BP 113/70    Pulse 75    Temp 98.1 F (36.7 C) (Oral)    Resp 16    Ht 4' 11.5" (1.511 m)    Wt 54.4 kg    LMP 05/18/2018    SpO2 98%    BMI 23.83 kg/m  If your blood pressure (BP) was elevated on multiple readings during this visit above 130 for the top number or above 80 for the bottom number, please have this repeated by your primary care provider within one month. --------------  Thank you for allowing us to participate in your care today.

## 2018-06-08 NOTE — ED Triage Notes (Addendum)
Patient a mass under the left breast area. Patient states she has seen a physician in the past regarding the mass and was told she needed a biopsy, but has not had the money to do so. Patient reports increased pain to the area that radiates into the left breast and left arm. Patient states she has had the mass x 1 year, pain increased over the past 3 months and much worse in the past few days.

## 2018-06-08 NOTE — ED Provider Notes (Signed)
[facility]-EMERGENCY DEPT Provider Note   CSN: [id number] Arrival date & time: 06/08/18  1053     History   Chief Complaint Chief Complaint  Patient presents with  . Mass    HPI [name] is a 27 y.o. female.  HPI   Patient is a 27 year old female with a history of MRSA cellulitis and abscesses presenting for left breast pain and sensation of a swelling mass.  Patient reports she has had left breast pain and lump for approximately 1 year, but reports that worsening over the past 4 months.  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  Patient reports she has tried tramadol and nonsteroidal medications without relief.  Patient denies any erythema, drainage, nipple drainage, fever, chills.  Patient reports that she has an extensive family history of breast cancer particularly in her father side.  Patient reports that she was in the [place] care system and does not know much about her mother's family's medical history. Patient reports that her paternal grandmother had breast cancer in her 74s, and she has 4-5 paternal aunts diagnosed with breast cancer in the 38s and 69s.  Past Medical History:  Diagnosis Date  . MRSA (methicillin resistant staph aureus) culture positive     Patient Active Problem List   Diagnosis Date Noted  . Cellulitis and abscess 03/15/2015  . Leukocytosis 03/12/2015    History reviewed. No pertinent surgical history.   OB History   None      Home Medications    Prior to Admission medications   Medication Sig Start Date End Date Taking? Authorizing Provider  acetaminophen (TYLENOL) 500 MG tablet Take 1,000 mg by mouth every 6 (six) hours as needed for moderate pain.   Yes [provider]  cetirizine-pseudoephedrine (ZYRTEC-D) 5-120 MG tablet Take 1 tablet by mouth daily. Patient not taking: Reported on 06/08/2018 01/18/18   Belinda Fisher, PA-C  fluticasone  [facility]) 50 MCG/ACT nasal spray Place 2 sprays into both nostrils daily. Patient not taking: Reported on 06/08/2018 01/18/18   Belinda Fisher, PA-C  ipratropium (ATROVENT) 0.06 % nasal spray Place 2 sprays into both nostrils 4 (four) times daily. Patient not taking: Reported on 06/08/2018 01/18/18   Belinda Fisher, PA-C  meloxicam (MOBIC) 15 MG tablet Take 1 tablet (15 mg total) by mouth daily. Patient not taking: Reported on 06/08/2018 03/31/18   Evon Slack, PA-C  traMADol (ULTRAM) 50 MG tablet Take 1 tablet (50 mg total) by mouth every 6 (six) hours as needed. Patient not taking: Reported on 06/08/2018 03/31/18   Evon Slack, PA-C    Family History Family History  Problem Relation Age of Onset  . Hypertension Unknown     Social History Social History   Tobacco Use  . Smoking status: Current Every Day Smoker    Packs/day: 0.50    Types: Cigarettes  . Smokeless tobacco: Never Used  Substance Use Topics  . Alcohol use: No  . Drug use: No     Allergies   Patient has no known allergies.   Review of Systems Review of Systems  Constitutional: Negative for chills and fever.  HENT: Negative for congestion and sore throat.   Eyes: Negative for visual disturbance.  Respiratory: Negative for cough, chest tightness and shortness of breath.   Cardiovascular: Negative for chest pain.  Gastrointestinal: Negative for abdominal pain, nausea and vomiting.  Genitourinary: Negative for dysuria  and flank pain.  Musculoskeletal: Negative for back pain and myalgias.  Skin: Negative for rash.       +Breast pain  Neurological: Negative for dizziness, syncope, light-headedness and headaches.     Physical Exam Updated Vital Signs BP 113/70   Pulse 75   Temp 98.1 F (36.7 C) (Oral)   Resp 16   Ht 4' 11.5" (1.511 m)   Wt 54.4 kg   LMP 05/18/2018   SpO2 98%   BMI 23.83 kg/m   Physical Exam  Constitutional: She appears well-developed and well-nourished.  Tearful during exam  HENT:    Head: Normocephalic and atraumatic.  Mouth/Throat: Oropharynx is clear and moist.  Eyes: Pupils are equal, round, and reactive to light. Conjunctivae and EOM are normal.  Neck: Normal range of motion. Neck supple.  Cardiovascular: Normal rate, regular rhythm, S1 normal and S2 normal.  No murmur heard. Pulmonary/Chest: Effort normal and breath sounds normal. She has no wheezes. She has no rales.  Abdominal: Soft. She exhibits no distension. There is no tenderness. There is no guarding.  Musculoskeletal: Normal range of motion. She exhibits no edema or deformity.  Lymphadenopathy:    She has no axillary adenopathy.  Neurological: She is alert.  Cranial nerves grossly intact. Patient moves extremities symmetrically and with good coordination.  Skin: Skin is warm and dry. No rash noted. No erythema.  Breast exam performed with nurse tech chaperone present.  Patient has approximately 2 cm nodular mobile mass in approximately the 6 o'clock position of the left breast.  There is no nipple discharge.  Patient exhibits no dimpling of the breast tissue in multiple positions.  There is no axillary lymphadenopathy.  Old, well-healed horizontal linear scars on bilateral upper extremities.   Psychiatric: She has a normal mood and affect. Her behavior is normal. Judgment and thought content normal.  Nursing note and vitals reviewed.    ED Treatments / Results  Labs (all labs ordered are listed, but only abnormal results are displayed) Labs Reviewed  POC URINE PREG, ED    EKG None  Radiology No results found.  Procedures Procedures (including critical care time)  Medications Ordered in ED Medications  ketorolac (TORADOL) 30 MG/ML injection 30 mg (30 mg Intramuscular Given 06/08/18 1212)     Initial Impression / Assessment and Plan / ED Course  I have reviewed the triage vital signs and the nursing notes.  Pertinent labs & imaging results that were available during my care of the  patient were reviewed by me and considered in my medical decision making (see chart for details).     Patient nontoxic-appearing, afebrile, no acute distress.  There is no evidence of mastitis or breast abscess.  Suspect fibroadenoma versus breast cyst.  Patient is not pregnant as evidenced by POC urine pregnancy test.  Given patient's extensive family history of breast cancer, discussed options for screening and biopsy with case manager, Merrilyn Puma, RN.  Patient set up for appointment at patient care center to establish care, and follow-up with specialty care.  Patient was given return precautions for any erythema, purulent drainage, or fever chills.  Patient is understanding and agrees with plan of care.  Final Clinical Impressions(s) / ED Diagnoses   Final diagnoses:  Left breast mass    ED Discharge Orders         Ordered    naproxen (NAPROSYN) 375 MG tablet  2 times daily     06/08/18 1338  Elisha PonderMurray, Camden Knotek B, PA-C 06/08/18 1349    Loren RacerYelverton, David, MD 06/08/18 [phone]

## 2018-06-09 ENCOUNTER — Telehealth: Payer: Self-pay

## 2018-06-09 NOTE — Telephone Encounter (Signed)
Message received from Eldridge AbrahamsAngela Kritzer, RN CM requesting a hospital follow up appointment for the [facility] at [facility]. Informed her that the patient has an appointment scheduled with Dr Jillyn HiddenFulp on 06/25/18

## 2018-06-25 ENCOUNTER — Other Ambulatory Visit: Payer: Self-pay

## 2018-06-25 ENCOUNTER — Telehealth: Payer: Self-pay

## 2018-06-25 ENCOUNTER — Encounter: Payer: Self-pay | Admitting: Family Medicine

## 2018-06-25 ENCOUNTER — Ambulatory Visit: Payer: Self-pay | Attending: Family Medicine | Admitting: Licensed Clinical Social Worker

## 2018-06-25 ENCOUNTER — Ambulatory Visit: Payer: Self-pay | Attending: Family Medicine | Admitting: Family Medicine

## 2018-06-25 VITALS — BP 130/73 | HR 108 | Temp 98.2°F | Resp 18 | Ht 59.0 in | Wt 125.4 lb

## 2018-06-25 DIAGNOSIS — Z599 Problem related to housing and economic circumstances, unspecified: Secondary | ICD-10-CM

## 2018-06-25 DIAGNOSIS — F419 Anxiety disorder, unspecified: Secondary | ICD-10-CM

## 2018-06-25 DIAGNOSIS — N644 Mastodynia: Secondary | ICD-10-CM

## 2018-06-25 DIAGNOSIS — F329 Major depressive disorder, single episode, unspecified: Secondary | ICD-10-CM

## 2018-06-25 DIAGNOSIS — F32A Depression, unspecified: Secondary | ICD-10-CM

## 2018-06-25 DIAGNOSIS — R1900 Intra-abdominal and pelvic swelling, mass and lump, unspecified site: Secondary | ICD-10-CM

## 2018-06-25 DIAGNOSIS — R222 Localized swelling, mass and lump, trunk: Secondary | ICD-10-CM

## 2018-06-25 DIAGNOSIS — F17210 Nicotine dependence, cigarettes, uncomplicated: Secondary | ICD-10-CM | POA: Insufficient documentation

## 2018-06-25 DIAGNOSIS — R1010 Upper abdominal pain, unspecified: Secondary | ICD-10-CM

## 2018-06-25 MED ORDER — ALPRAZOLAM 1 MG PO TABS: ORAL_TABLET | ORAL | 0 refills | Status: DC

## 2018-06-25 MED ORDER — TRAMADOL HCL 50 MG PO TABS: 50.0000 mg | ORAL_TABLET | Freq: Four times a day (QID) | ORAL | 0 refills | Status: AC | PRN

## 2018-06-25 MED ORDER — SERTRALINE HCL 50 MG PO TABS: 50.0000 mg | ORAL_TABLET | Freq: Every day | ORAL | 1 refills | Status: DC

## 2018-06-25 MED FILL — traMADol HCL 50 MG TABS: 50 | 5 days supply | Qty: 20 | Fill #0

## 2018-06-25 MED FILL — SERTRALINE HCL 50 MG TABS: 50 | 30 days supply | Qty: 30 | Fill #0

## 2018-06-25 NOTE — Telephone Encounter (Signed)
Patient was called, no answer, lvm to return call. If patient returns call please ask the patient what pharmacy she would like her xanax to be sent to, [facility] does not carry.

## 2018-06-25 NOTE — BH Specialist Note (Signed)
Integrated Behavioral Health Initial Visit  MRN: [id number] Name: [name]  Number of [place] [facility] Clinician visits:: Initial Visit Session Start time: 3:15pm  Session End time: 3:45pm Total time: 30 minutes  Type of Service: Gratis Interpretor:No.    Warm Hand Off Completed.       SUBJECTIVE: [name] is a 27 y.o. female accompanied by friends daughter Patient was referred by PCP Cammie Fulp for housing resources and anxiety and depression. Patient reports the following symptoms/concerns: Pt reports that she is currently living with different friends each night due to an unhealthy relationship that caused her to have to move out. Pt is currently in need of affordable housing. Pt also wants to restart her medication for depression.  Duration of problem: Housing concerns - 2 weeks, Depression: Since age 72; Severity of problem: moderate  OBJECTIVE: Mood: Anxious and Affect: Appropriate Risk of harm to self or others: No plan to harm self or others   LIFE CONTEXT: Family and Social: Pt reports having adequate support system form friends. Pt also reports that her friends are currently allowing her to stay at their house but it is only temporary. Pt currently has two school-aged children. School/Work: Pt is currently employed full-time. 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 Pt reports not having enough money for food for herself, but her children's nutritional needs are always met.  Self-Care: Pt did not report any form of self-care.  Life Changes: Pt was recently in an "unhealthy relationship" that caused her to not be in the same housing. Once pt moved out the only place she could go was to her friends house, but pt stated "they don't want roommates". Pt also has ongoing pain in her chest and has followed-up with PCP regarding these concerns. Pt reports  lack of insurance and that she had medicaid, but no longer has it.   GOALS ADDRESSED: Patient will: 1. Reduce symptoms of: depression and stress 2. Increase knowledge and/or ability of: self-management skills and stress reduction  3. Demonstrate ability to: Increase healthy adjustment to current life circumstances and Increase adequate support systems for patient/family  INTERVENTIONS: Interventions utilized: Supportive Counseling and Psychoeducation and/or Health Education  Standardized Assessments completed: GAD-7 and PHQ 9  ASSESSMENT: Patient currently experiencing stress due to recent life changes with unhealthy relationship and lack of housing. Pt was anxious initially when discussing her concerns about housing. Pt reports having a diagnosis of PTSD and depression at age 8. She reports that she was taking medication for both mental health issues, but has not recently taken them once her medicaid was cut. Pt stated that she had the desire to restart medication for depression. Pt stated that she has attended therapy before. SW intern informed PCP of pt concerns about medication for depression.   Pt was trying to find comfortable sitting positions during session. It was observed that pt may have been in pain.   SW intern informed pt about financial counseling and pt reports that she received the form for the blue card. SW informed pt that she could make an appt with financial counseling the following week. SW Intern informed pt about socialserve and [employer] to aid in housing planning. SW intern also informed pt about food resources within [place] and the [employer].    Patient may benefit from therapy, medication management, and housing assistance.  PLAN: 1. Follow up with  behavioral health clinician on : as needed 2. Behavioral recommendations: develop coping skills 3. Referral(s): [place] (In Clinic) and [employer] Resources:   Food and Housing (Pt left before SW intern was able to provide resources to pt for social serve, [place] housing coalition, and food) 4. "From scale of 1-10, how likely are you to follow plan?": 2  Ruffin Pyo, MSW Intern 06/25/18, 5:17pm

## 2018-06-25 NOTE — Progress Notes (Signed)
Flu shot: yes  Pain: left rib cage,  Pain caused patient to fall in shower, no injury reported.

## 2018-06-26 LAB — COMPREHENSIVE METABOLIC PANEL WITH GFR
ALT: 8 IU/L (ref 0–32)
AST: 14 IU/L (ref 0–40)
Albumin/Globulin Ratio: 1.7 (ref 1.2–2.2)
Albumin: 4.4 g/dL (ref 3.5–5.5)
Alkaline Phosphatase: 77 IU/L (ref 39–117)
BUN/Creatinine Ratio: 24 — ABNORMAL HIGH (ref 9–23)
BUN: 18 mg/dL (ref 6–20)
Bilirubin Total: <0.2 mg/dL (ref 0.0–1.2)
CO2: 20 mmol/L (ref 20–29)
Calcium: 9.1 mg/dL (ref 8.7–10.2)
Chloride: 102 mmol/L (ref 96–106)
Creatinine, Ser: 0.75 mg/dL (ref 0.57–1.00)
GFR calc Af Amer: 125 mL/min/1.73
GFR calc non Af Amer: 108 mL/min/1.73
Globulin, Total: 2.6 g/dL (ref 1.5–4.5)
Glucose: 83 mg/dL (ref 65–99)
Potassium: 3.9 mmol/L (ref 3.5–5.2)
Sodium: 140 mmol/L (ref 134–144)
Total Protein: 7 g/dL (ref 6.0–8.5)

## 2018-06-26 LAB — CBC WITH DIFFERENTIAL/PLATELET
Basophils Absolute: 0 x10E3/uL (ref 0.0–0.2)
Basos: 0 %
EOS (ABSOLUTE): 0.2 x10E3/uL (ref 0.0–0.4)
Eos: 2 %
Hematocrit: 42 % (ref 34.0–46.6)
Hemoglobin: 13.7 g/dL (ref 11.1–15.9)
Immature Grans (Abs): 0 x10E3/uL (ref 0.0–0.1)
Immature Granulocytes: 0 %
Lymphocytes Absolute: 3.1 x10E3/uL (ref 0.7–3.1)
Lymphs: 27 %
MCH: 31.6 pg (ref 26.6–33.0)
MCHC: 32.6 g/dL (ref 31.5–35.7)
MCV: 97 fL (ref 79–97)
Monocytes Absolute: 1 x10E3/uL — ABNORMAL HIGH (ref 0.1–0.9)
Monocytes: 9 %
Neutrophils Absolute: 7.1 x10E3/uL — ABNORMAL HIGH (ref 1.4–7.0)
Neutrophils: 62 %
Platelets: 280 x10E3/uL (ref 150–450)
RBC: 4.33 x10E6/uL (ref 3.77–5.28)
RDW: 13.5 % (ref 12.3–15.4)
WBC: 11.5 x10E3/uL — ABNORMAL HIGH (ref 3.4–10.8)

## 2018-06-27 NOTE — Progress Notes (Signed)
Subjective:    Patient ID: [name], female    DOB: 11/07/1990, 27 y.o.   MRN: [id number]  HPI 27 year old female new to the practice.  Patient is status post recent emergency department visit on 06/08/2018 secondary to complaint of left breast pain/left breast mass.  Patient has also noticed a mass beneath her left breast over her ribs that has been present for about a year but over the past 4 months, patient states that the area has gotten larger and has become painful.  Patient works as a Advertising copywriterhousekeeper and states that now when she is working and especially if she lifts her arms overhead she has increased pain.  Patient states that the pain is generally about a 4 5 but when she is doing any type of activity, her pain can be a 10 or greater.  Patient also states that she had a recent episode where she had sudden onset of pain which actually caused her to fall in her shower.  Patient states that over the past few weeks, when she is having intense pain on her left chest/abdomen she is actually here to get someone to carry her.  Patient reports a paternal family history of mother with breast cancer when she was in her 4950s and patient has had several aunts on her father's side of the family who have been diagnosed with breast cancer when they were in their 5050s and 9260s.  Patient states that she feels as if there are lumps in her left breast and they are painful.  Patient denies any nipple discharge, no skin changes and no axillary adenopathy.  Patient reports her only surgical history consist of prior drainage of abscesses on her breasts.  Past Medical History:  Diagnosis Date  . MRSA (methicillin resistant staph aureus) culture positive    Social History   Tobacco Use  . Smoking status: Current Every Day Smoker    Packs/day: 0.50    Types: Cigarettes  . Smokeless tobacco: Never Used  Substance Use Topics  . Alcohol use: No  . Drug use: No     Review of Systems  Constitutional: Positive for  fatigue. Negative for chills and fever.  HENT: Negative for sore throat and trouble swallowing.   Respiratory: Negative for cough and shortness of breath.   Cardiovascular: Positive for chest pain (Left lower chest wall pain and left breast pain). Negative for palpitations and leg swelling.  Gastrointestinal: Positive for abdominal pain. Negative for nausea.  Genitourinary: Negative for dysuria and frequency.  Musculoskeletal: Negative for back pain and gait problem.  Neurological: Negative for dizziness and headaches.  Psychiatric/Behavioral: Negative for suicidal ideas. The patient is nervous/anxious.        Objective:   Physical Exam BP 130/73   Pulse (!) 108   Temp 98.2 F (36.8 C) (Oral)   Resp 18   Ht 4\' 11"  (1.499 m)   Wt 125 lb 6.4 oz (56.9 kg)   LMP  (Within Weeks) Comment: 3 weeks ago   SpO2 99%   BMI 25.33 kg/m Nurse's notes and vital signs reviewed General- well-nourished, well-developed female in no acute distress but patient appears anxious.  Patient is also accompanied by her young daughter who appears to be under the age of 655 and who is very active Neck-supple, no lymphadenopathy, no thyromegaly Lungs-clear to auscultation bilaterally Cardiovascular-regular rate and rhythm Breast exam- right breast with normal exam, no palpable masses or tenderness, no nipple discharge, no skin changes and no axillary adenopathy.  Left breast with area of tenderness at the inferior areola and at approximately 4:00 near the breast.  No axillary adenopathy, no skin changes and no nipple discharge on the left Chest- patient does have a raised, palpable mass that is soft and compressible on the left chest wall/upper abdomen.  Lower ribs are tender to palpate beneath the mass which is compressible.  Mass is skin colored but does contain some increased vascularity/telangiectasias. Abdomen- patient with left upper quadrant tenderness to palpation with mild voluntary guarding.  No involuntary  guarding or rebound Back-no CVA tenderness Extremities-no edema Psych- patient appears anxious and appears slightly tearful at times       Assessment & Plan:  1. Mass of chest wall, left Patient with a mass on the lower left chest wall/upper abdomen.  I consulted with radiology and they felt that the best test would be an MRI of the abdomen with and without contrast but to also mention in the orders that patient also with left chest wall pain so that the left lower chest would also be included in the MRI area.  Patient will also have CBC and CMP in follow-up of the mass.  Prescription for tramadol to take as needed for pain.  Patient states that she has had a prior MRI and required medication for anxiety.  Prescription provided for Xanax 1 mg to take approximately 30 minutes prior to the MRI. - CBC with Differential - Comprehensive metabolic panel - MR Abdomen W Wo Contrast; Future - traMADol (ULTRAM) 50 MG tablet; Take 1 tablet (50 mg total) by mouth every 6 (six) hours as needed for up to 5 days for moderate pain.  Dispense: 20 tablet; Refill: 0 - ALPRAZolam (XANAX) 1 MG tablet; Take one pill 30 minutes before MRI  Dispense: 1 tablet; Refill: 0  2. Upper abdominal pain Patient with left upper abdominal pain for which MRI of the abdomen will be obtained along with a CBC and CMP - CBC with Differential - Comprehensive metabolic panel - MR Abdomen W Wo Contrast; Future  3. Breast pain, left Patient will be referred for diagnostic mammogram and follow-up of her left breast pain   4. Anxiety and depression Patient reports anxiety and depression and states that she was on Zoloft in the past and would like to restart this medication.  Prescription was provided for Zoloft 50 mg and patient is to start half pill (25mg ) at bedtime for 7 nights then increase to 50 mg.  Patient also had consultation by social work at today's visit.  Patient will return in 2 weeks for reevaluation and follow-up of  results. - sertraline (ZOLOFT) 50 MG tablet; Take 1 tablet (50 mg total) by mouth at bedtime.  Dispense: 30 tablet; Refill: 1  An After Visit Summary was printed and given to the patient.  Return in about 2 weeks (around 07/09/2018).

## 2018-06-29 ENCOUNTER — Ambulatory Visit (HOSPITAL_COMMUNITY)
Admission: RE | Admit: 2018-06-29 | Discharge: 2018-06-29 | Disposition: A | Discharge status: Home / Self Care | Payer: Self-pay | Source: Ambulatory Visit | Attending: Family Medicine | Admitting: Family Medicine

## 2018-06-29 DIAGNOSIS — R1010 Upper abdominal pain, unspecified: Secondary | ICD-10-CM | POA: Insufficient documentation

## 2018-06-29 DIAGNOSIS — R222 Localized swelling, mass and lump, trunk: Secondary | ICD-10-CM | POA: Insufficient documentation

## 2018-06-29 MED ORDER — GADOBUTROL 1 MMOL/ML IV SOLN
5.0000 mL | Freq: Once | INTRAVENOUS | Status: AC | PRN
  Administered 2018-06-29: 5 mL via INTRAVENOUS

## 2018-07-02 ENCOUNTER — Telehealth: Payer: Self-pay | Admitting: Family Medicine

## 2018-07-02 NOTE — Telephone Encounter (Signed)
Patient called wanting to get her imaging results. Please follow up with patient.

## 2018-07-07 ENCOUNTER — Ambulatory Visit: Payer: Self-pay | Admitting: Licensed Clinical Social Worker

## 2018-07-07 ENCOUNTER — Ambulatory Visit: Payer: Self-pay | Attending: Family Medicine | Admitting: Physician Assistant

## 2018-07-07 VITALS — BP 120/80 | HR 104 | Temp 97.4°F | Resp 18 | Ht 59.0 in | Wt 124.0 lb

## 2018-07-07 DIAGNOSIS — F4329 Adjustment disorder with other symptoms: Secondary | ICD-10-CM

## 2018-07-07 DIAGNOSIS — R52 Pain, unspecified: Secondary | ICD-10-CM

## 2018-07-07 DIAGNOSIS — M940 Chondrocostal junction syndrome [Tietze]: Secondary | ICD-10-CM

## 2018-07-07 DIAGNOSIS — F329 Major depressive disorder, single episode, unspecified: Secondary | ICD-10-CM

## 2018-07-07 DIAGNOSIS — F419 Anxiety disorder, unspecified: Secondary | ICD-10-CM

## 2018-07-07 DIAGNOSIS — R12 Heartburn: Secondary | ICD-10-CM

## 2018-07-07 DIAGNOSIS — Z79899 Other long term (current) drug therapy: Secondary | ICD-10-CM | POA: Insufficient documentation

## 2018-07-07 MED ORDER — OMEPRAZOLE 20 MG PO CPDR: 20.0000 mg | DELAYED_RELEASE_CAPSULE | Freq: Every day | ORAL | 3 refills | Status: DC

## 2018-07-07 MED ORDER — NAPROXEN 500 MG PO TABS: 500.0000 mg | ORAL_TABLET | Freq: Two times a day (BID) | ORAL | 0 refills | Status: DC

## 2018-07-07 MED ORDER — SERTRALINE HCL 50 MG PO TABS: 50.0000 mg | ORAL_TABLET | Freq: Every day | ORAL | 1 refills | Status: DC

## 2018-07-07 MED ORDER — METHOCARBAMOL 500 MG PO TABS: 1000.0000 mg | ORAL_TABLET | Freq: Three times a day (TID) | ORAL | 0 refills | Status: DC

## 2018-07-07 MED FILL — NAPROXEN 500 MG TABLET: 500 | 30 days supply | Qty: 60 | Fill #0

## 2018-07-07 MED FILL — METHOCARBAMOL 500 MG TABS: 500 | 15 days supply | Qty: 90 | Fill #0

## 2018-07-07 MED FILL — OMEPRAZOLE 20 MG CAP: 20 | 30 days supply | Qty: 30 | Fill #0

## 2018-07-07 NOTE — Progress Notes (Signed)
Patient ID: [name], female   DOB: 1990-12-21, 27 y.o.   MRN: [id number]   [name], is a 27 y.o. female  WUJ:[id number]  [id number]  DOB - 02-14-1991  Subjective:  Chief Complaint and HPI: [name] is a 27 y.o. female here today for continued chest pain, body aches, and no having B leg pain.  CP has been going on about 4 months.  Recent MRI abd/chest was neg. No fever/chills.  NKI.  She does admit to frequent heartburn.    She is taking Zoloft at 25mg .  Hasn't noticed any difference yet.  Denies SI-says she sometimes feels it would be better if she "wasn't here."  No plans or intent.    ROS:   Constitutional:  No f/c, No night sweats, No unexplained weight loss. EENT:  No vision changes, No blurry vision, No hearing changes. No mouth, throat, or ear problems.  Respiratory: No cough, No SOB Cardiac: No CP, no palpitations GI:  No abd pain, No N/V/D. GU: No Urinary s/sx Musculoskeletal: chest, back, leg pain Neuro: No headache, no dizziness, no motor weakness.  Skin: No rash Endocrine:  No polydipsia. No polyuria.  Psych: Denies SI/HI  No problems updated.  ALLERGIES: No Known Allergies  PAST MEDICAL HISTORY: Past Medical History:  Diagnosis Date  . MRSA (methicillin resistant staph aureus) culture positive     MEDICATIONS AT HOME: Prior to Admission medications   Medication Sig Start Date End Date Taking? Authorizing Provider  acetaminophen (TYLENOL) 500 MG tablet Take 1,000 mg by mouth every 6 (six) hours as needed for moderate pain.   Yes [provider]  fluticasone (FLONASE) 50 MCG/ACT nasal spray Place 2 sprays into both nostrils daily. 01/18/18  Yes Yu, Amy V, PA-C  sertraline (ZOLOFT) 50 MG tablet Take 1 tablet (50 mg total) by mouth at bedtime. 07/07/18  Yes Shalandra Leu, Marzella Schlein, PA-C  methocarbamol (ROBAXIN) 500 MG tablet Take 2 tablets (1,000 mg total) by mouth 3 (three) times daily. X 10days then prn muscle pain/spasm 07/07/18   Georgian Co  M, PA-C  naproxen (NAPROSYN) 500 MG tablet Take 1 tablet (500 mg total) by mouth 2 (two) times daily with a meal. X 10 days then prn pain 07/07/18   Georgian Co M, PA-C  omeprazole (PRILOSEC) 20 MG capsule Take 1 capsule (20 mg total) by mouth daily. 07/07/18   Anders Simmonds, PA-C     Objective:  EXAM:   Vitals:   07/07/18 1557  BP: 120/80  Pulse: (!) 104  Resp: 18  Temp: (!) 97.4 F (36.3 C)  TempSrc: Oral  SpO2: 100%  Weight: 124 lb (56.2 kg)  Height: 4\' 11"  (1.499 m)    General appearance : A&OX3. NAD. Non-toxic-appearing HEENT: Atraumatic and Normocephalic.  PERRLA. EOM intact.   Neck: supple, no JVD. No cervical lymphadenopathy. No thyromegaly Chest/Lungs:  Breathing-non-labored, Good air entry bilaterally, breath sounds normal without rales, rhonchi, or wheezing.  +TTP at costochondral angles anteriorly that is reproducible with palpation.   CVS: S1 S2 regular, no murmurs, gallops, rubs  Abdomen: Bowel sounds present, Non tender and not distended with no gaurding, rigidity or rebound. Extremities: Bilateral Lower Ext shows no edema, both legs are warm to touch with = pulse throughout Neurology:  CN II-XII grossly intact, Non focal.   Psych:  TP linear. J/I fair. Normal speech. Appropriate eye contact and flat affect.  Skin:  No Rash  Data Review No results found for: HGBA1C  Depression screen [name] 2/9 07/07/2018  06/25/2018  Decreased Interest 3 3  Down, Depressed, Hopeless 2 2  PHQ - 2 Score 5 5  Altered sleeping 3 3  Tired, decreased energy 3 3  Change in appetite 1 1  Feeling bad or failure about yourself  1 2  Trouble concentrating 1 1  Moving slowly or fidgety/restless 3 1  Suicidal thoughts 1 0  PHQ-9 Score 18 16    Assessment & Plan   1. Heartburn - H. pylori breath test - omeprazole (PRILOSEC) 20 MG capsule; Take 1 capsule (20 mg total) by mouth daily.  Dispense: 30 capsule; Refill: 3  2. Costochondritis - naproxen (NAPROSYN) 500 MG tablet;  Take 1 tablet (500 mg total) by mouth 2 (two) times daily with a meal. X 10 days then prn pain  Dispense: 60 tablet; Refill: 0 - methocarbamol (ROBAXIN) 500 MG tablet; Take 2 tablets (1,000 mg total) by mouth 3 (three) times daily. X 10days then prn muscle pain/spasm  Dispense: 90 tablet; Refill: 0  3. Body aches - Sedimentation Rate - Vitamin D, 25-hydroxy - CBC with Differential/Platelet - TSH  4. Anxiety and depression - Vitamin D, 25-hydroxy - TSH - sertraline (ZOLOFT) 50 MG tablet; Take 1 tablet (50 mg total) by mouth at bedtime.  Dispense: 30 tablet; Refill: 1 -had patient also meet with social worker  Patient have been counseled extensively about nutrition and exercise  Return in about 1 month (around 08/06/2018) for Dr Jillyn Hidden;  f/up depression/body pain.  The patient was given clear instructions to go to ER or return to medical center if symptoms don't improve, worsen or new problems develop. The patient verbalized understanding. The patient was told to call to get lab results if they haven't heard anything in the next week.     Georgian Co, PA-C [facility] [place], [place] [phone]   07/07/2018, 4:19 PM

## 2018-07-07 NOTE — Patient Instructions (Signed)
Costochondritis Costochondritis is swelling and irritation (inflammation) of the tissue (cartilage) that connects your ribs to your breastbone (sternum). This causes pain in the front of your chest. Usually, the pain:  Starts gradually.  Is in more than one rib.  This condition usually goes away on its own over time. Follow these instructions at home:  Do not do anything that makes your pain worse.  If directed, put ice on the painful area: ? Put ice in a plastic bag. ? Place a towel between your skin and the bag. ? Leave the ice on for 20 minutes, 2-3 times a day.  If directed, put heat on the affected area as often as told by your doctor. Use the heat source that your doctor tells you to use, such as a moist heat pack or a heating pad. ? Place a towel between your skin and the heat source. ? Leave the heat on for 20-30 minutes. ? Take off the heat if your skin turns bright red. This is very important if you cannot feel pain, heat, or cold. You may have a greater risk of getting burned.  Take over-the-counter and prescription medicines only as told by your doctor.  Return to your normal activities as told by your doctor. Ask your doctor what activities are safe for you.  Keep all follow-up visits as told by your doctor. This is important. Contact a doctor if:  You have chills or a fever.  Your pain does not go away or it gets worse.  You have a cough that does not go away. Get help right away if:  You are short of breath. This information is not intended to replace advice given to you by your health care provider. Make sure you discuss any questions you have with your health care provider. Document Released: 03/17/2008 Document Revised: 04/18/2016 Document Reviewed: 01/23/2016 Elsevier Interactive Patient Education  2018 Elsevier Inc.  

## 2018-07-07 NOTE — BH Specialist Note (Signed)
Integrated Behavioral Health Follow Up Visit  MRN: [id number] Name: [name]  Number of [place] Clinician visits: 2/6 Session Start time: 4:30 PM  Session End time: 4:50 PM Total time: 20 minutes  Type of Service: Integrated Behavioral Health- Individual/Family Interpretor:No.   SUBJECTIVE: [name] is a 27 y.o. female accompanied by SELF Patient was referred by Weyman Pedro for anxiety and depression. Patient reports the following symptoms/concerns: Pt reports that she is currently living with a friend due to an unhealthy relationship that caused her to have to move out. Pt is still in need of affordable housing. Pt presents with anxiety and depression. Pt is having difficulty sleeping and relaxing due to ongoing body pain. Pt was diagnosed with PTSD at age 4. Duration of problem: Housing concerns - 1 month, anxiety and depression- since age 39; Severity of problem: moderate  OBJECTIVE: Mood: Anxious and Depressed and Affect: Depressed Risk of harm to self or others: No plan to harm self or others  LIFE CONTEXT: Family and Social: Pt reports having adequate support system form friends. Pt also reports that her friends are currently allowing her to stay at their house but it is only temporary. Pt currently has two school-aged children. School/Work: Pt is currently employed full-time. 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 Pt reports not having enough money for food for herself, but her children's nutritional needs are always met. Pt has recently missed a few days at work due to being in pain. Self-Care: Pt reports current tobacco use as a coping skill. Pt is currently taking medication for depression.   Life Changes: Pt was recently in an "unhealthy relationship" that caused her to not be in the same housing. Once pt moved out the only place she could go was to her friends house, but pt stated  "they don't want roommates". Pt also has ongoing pain in her chest and has followed-up with PCP regarding these concerns. Pt reports lack of insurance and that she had medicaid, but no longer has it.  (minimal has changed since last visit on 06/25/18) GOALS ADDRESSED: Patient will: 1.  Reduce symptoms of: anxiety, depression and stress  2.  Increase knowledge and/or ability of: coping skills, self-management skills and stress reduction  3.  Demonstrate ability to: Increase healthy adjustment to current life circumstances and Increase adequate support systems for patient/family  INTERVENTIONS: Interventions utilized:  Supportive Counseling and Sleep Hygiene Standardized Assessments completed: GAD-7 and PHQ 9  ASSESSMENT: Patient currently experiencing stress due to recent unhealthy relationship and lack of housing. Pt is currently living with her friend with two school-aged children. Pt has the desire to find stable housing. Pt is employed full-time, but she has been taking time off due to medical condition. Pt is concerned that she has not worked enough hours, but still has the desire to maintain employment.   Pt is experiencing recurring anxiety and has not been getting an adequate amount of sleep. Pt reports getting between 2-3 hours of sleep per night. Pt stated that she wakes up multiple times at night and when she does wake up, she goes to smoke a cigarette to calm her down. Pt reports having a diagnosis of PTSD and depression at age 62. She has recently started taking medication again for depression. Pt reports that the medication has been helping and her dosage will be increased. Pt reports that she has attended therapy in the  past and is interested in beginning therapy again. Pt's medicaid has run out so she is need of financial counseling. MSW Intern provided pt with information on financial counseling and she intends to make an appt.   MSW Intern provided pt with food and housing resources,  and counseling resources within the community. MSW Intern also provided patient with meditation resources, relaxation techniques, and breathing exercises to assist with anxiety. Pt was highly encouraged to attend counseling to assist with PTSD and depression. Patient may benefit from persistent supportive counseling and regular mindfulness exercises.  PLAN: 1. Follow up with behavioral health clinician on : MSW intern encouraged pt to schedule an appt to follow-up and if she needed further assistance with housing.  2. Behavioral recommendations: deep breathing, relaxation techniques, mindful meditation, attending counseling, and developing healthy coping skills 3. Referral(s): [facility] (In Clinic), [facility] (LME/Outside Clinic) and [employer] Resources:  Food and Housing 4. "From scale of 1-10, how likely are you to follow plan?": [place], MSW Intern 07/08/18, 4:48pm

## 2018-07-08 LAB — CBC WITH DIFFERENTIAL/PLATELET
BASOS: 1 %
Basophils Absolute: 0 10*3/uL (ref 0.0–0.2)
EOS (ABSOLUTE): 0.2 10*3/uL (ref 0.0–0.4)
Eos: 3 %
Hematocrit: 41.2 % (ref 34.0–46.6)
Hemoglobin: 13.9 g/dL (ref 11.1–15.9)
IMMATURE GRANS (ABS): 0 10*3/uL (ref 0.0–0.1)
Immature Granulocytes: 0 %
LYMPHS ABS: 2.2 10*3/uL (ref 0.7–3.1)
LYMPHS: 29 %
MCH: 32 pg (ref 26.6–33.0)
MCHC: 33.7 g/dL (ref 31.5–35.7)
MCV: 95 fL (ref 79–97)
MONOS ABS: 0.5 10*3/uL (ref 0.1–0.9)
Monocytes: 7 %
NEUTROS ABS: 4.7 10*3/uL (ref 1.4–7.0)
Neutrophils: 60 %
Platelets: 273 10*3/uL (ref 150–450)
RBC: 4.34 x10E6/uL (ref 3.77–5.28)
RDW: 12.1 % — ABNORMAL LOW (ref 12.3–15.4)
WBC: 7.7 10*3/uL (ref 3.4–10.8)

## 2018-07-08 LAB — TSH: TSH: 1.54 u[IU]/mL (ref 0.450–4.500)

## 2018-07-08 LAB — SEDIMENTATION RATE: Sed Rate: 9 mm/hr (ref 0–32)

## 2018-07-08 LAB — VITAMIN D 25 HYDROXY (VIT D DEFICIENCY, FRACTURES): Vit D, 25-Hydroxy: 23.7 ng/mL — ABNORMAL LOW (ref 30.0–100.0)

## 2018-07-09 LAB — H. PYLORI BREATH TEST: H PYLORI BREATH TEST: POSITIVE — AB

## 2018-07-12 ENCOUNTER — Other Ambulatory Visit: Payer: Self-pay | Admitting: Physician Assistant

## 2018-07-12 ENCOUNTER — Telehealth: Payer: Self-pay | Admitting: *Deleted

## 2018-07-12 DIAGNOSIS — R12 Heartburn: Secondary | ICD-10-CM

## 2018-07-12 MED ORDER — OMEPRAZOLE 20 MG PO CPDR: 20.0000 mg | DELAYED_RELEASE_CAPSULE | Freq: Two times a day (BID) | ORAL | 3 refills | Status: DC

## 2018-07-12 MED ORDER — CLARITHROMYCIN 500 MG PO TABS: 500.0000 mg | ORAL_TABLET | Freq: Two times a day (BID) | ORAL | 0 refills | Status: DC

## 2018-07-12 MED ORDER — VITAMIN D (ERGOCALCIFEROL) 1.25 MG (50000 UNIT) PO CAPS: 50000.0000 [IU] | ORAL_CAPSULE | ORAL | 0 refills | Status: AC

## 2018-07-12 MED ORDER — AMOXICILLIN 500 MG PO CAPS: 1000.0000 mg | ORAL_CAPSULE | Freq: Two times a day (BID) | ORAL | 0 refills | Status: DC

## 2018-07-12 NOTE — Telephone Encounter (Signed)
-----   Message from Anders Simmonds, [place] sent at 07/12/2018  2:43 PM EDT ----- Please call patient.  She tested +stomach ulcer bacteria.  I sent her 2 antibiotics and increased the dose of omeprazole for the next 2 weeks.  Also, her vitamin D is low.  This can contribute to muscle aches, anxiety, fatigue, and depression.  I have sent a prescription to the pharmacy for her to take once a week.  We will recheck this level in 3-4 months.  All of the prescriptions were sent to our pharmacy bc it will be the most cost effective.  Her thyroid and inflammatory rates were normal. Her blood count is normal.  Follow-up as planned.  Thanks, Georgian Co, PA-C

## 2018-07-12 NOTE — Telephone Encounter (Signed)
-----   Message from Cain Saupe, MD sent at 06/27/2018  6:32 PM EDT ----- Notify patient that she had a mild increase in white blood cell count at 11.5.  Normal is 3.4-10.8.  Patient should call or return if she feels that she is developing symptoms of infection.  Patient with a normal complete metabolic panel

## 2018-07-12 NOTE — Telephone Encounter (Signed)
Patient verified DOB Patient is aware of WBC being elevated but CMP being normal. Patient does complain of still not being able to keep anything down. Patient will follow up with PCP on 07/15/18 at 3:10 pm. No further questions at this time.

## 2018-07-12 NOTE — Telephone Encounter (Signed)
Patient verified DOB Patient is aware of vitamin d being low and needing to take the supplement once a week. Patient is aware of h pylori being positive and needing to take 3 medications for 2 weeks as prescribed. Patient is also aware of thyroid, inflammation and blood count being normal. Patient will pick up medications and follow up on 07/15/18.

## 2018-07-15 ENCOUNTER — Ambulatory Visit: Payer: Self-pay | Attending: Family Medicine | Admitting: Family Medicine

## 2018-07-15 ENCOUNTER — Encounter: Payer: Self-pay | Admitting: Family Medicine

## 2018-07-15 VITALS — BP 115/75 | HR 102 | Temp 98.2°F | Resp 18 | Ht 59.0 in | Wt 123.0 lb

## 2018-07-15 DIAGNOSIS — Z8249 Family history of ischemic heart disease and other diseases of the circulatory system: Secondary | ICD-10-CM | POA: Insufficient documentation

## 2018-07-15 DIAGNOSIS — N644 Mastodynia: Secondary | ICD-10-CM

## 2018-07-15 DIAGNOSIS — F17210 Nicotine dependence, cigarettes, uncomplicated: Secondary | ICD-10-CM | POA: Insufficient documentation

## 2018-07-15 DIAGNOSIS — R42 Dizziness and giddiness: Secondary | ICD-10-CM | POA: Insufficient documentation

## 2018-07-15 DIAGNOSIS — R222 Localized swelling, mass and lump, trunk: Secondary | ICD-10-CM | POA: Insufficient documentation

## 2018-07-15 DIAGNOSIS — B9681 Helicobacter pylori [H. pylori] as the cause of diseases classified elsewhere: Secondary | ICD-10-CM | POA: Insufficient documentation

## 2018-07-15 DIAGNOSIS — A048 Other specified bacterial intestinal infections: Secondary | ICD-10-CM

## 2018-07-15 DIAGNOSIS — R112 Nausea with vomiting, unspecified: Secondary | ICD-10-CM

## 2018-07-15 DIAGNOSIS — R0789 Other chest pain: Secondary | ICD-10-CM

## 2018-07-15 DIAGNOSIS — R1013 Epigastric pain: Secondary | ICD-10-CM

## 2018-07-15 MED ORDER — ONDANSETRON HCL 4 MG PO TABS: 4.0000 mg | ORAL_TABLET | Freq: Three times a day (TID) | ORAL | 0 refills | Status: DC | PRN

## 2018-07-15 MED ORDER — ONDANSETRON 4 MG PO TBDP
4.0000 mg | ORAL_TABLET | Freq: Once | ORAL | Status: AC
  Administered 2018-07-15: 4 mg via ORAL

## 2018-07-15 NOTE — Progress Notes (Signed)
Subjective:    Patient ID: [name], female    DOB: 12/27/1990, 27 y.o.   MRN: [id number]  HPI 27 yo female who is seen in follow-up of a mass on her left chest wall below her breast as well as pain in her left breast. Patient was also seen here recently by another provider and had a positive test for H. Pylori but has not been able to obtain the medication yet.  Patient states that within the next 2 days, she should be able to obtain the medication.  Patient however states that she does still have nausea as well as upper mid abdominal discomfort with a burning sensation.  Patient also complains of continued pain in her left chest below the left breast as well as pain in her left breast.  Patient states that she has not been contacted regarding the diagnostic mammogram.  Patient did have the MRI that was ordered at her last visit.  Patient denies any fever or chills.  Patient does have some lightheadedness/dizziness after throwing up and patient feels weak. Past Medical History:  Diagnosis Date  . MRSA (methicillin resistant staph aureus) culture positive    Family History  Problem Relation Age of Onset  . Hypertension Unknown    Social History   Tobacco Use  . Smoking status: Current Every Day Smoker    Packs/day: 0.50    Types: Cigarettes  . Smokeless tobacco: Never Used  Substance Use Topics  . Alcohol use: No  . Drug use: No  No Known Allergies   Review of Systems  Constitutional: Positive for fatigue. Negative for chills, diaphoresis and fever.  Respiratory: Negative for cough and shortness of breath.   Cardiovascular: Negative for chest pain, palpitations and leg swelling.       Patient with left lower chest wall pain and left breast pain  Gastrointestinal: Positive for abdominal pain, nausea and vomiting. Negative for blood in stool, constipation and diarrhea.  Genitourinary: Negative for dysuria and frequency.  Musculoskeletal: Negative for back pain, gait problem and  joint swelling.  Neurological: Positive for dizziness and light-headedness (Dizziness and lightheadedness after episodes of vomiting). Negative for headaches.       Objective:   Physical Exam BP 115/75 (BP Location: Left Arm, Patient Position: Sitting, Cuff Size: Normal)   Pulse (!) 102   Temp 98.2 F (36.8 C) (Oral)   Resp 18   Ht 4\' 11"  (1.499 m)   Wt 123 lb (55.8 kg)   LMP 07/02/2018   SpO2 97%   BMI 24.84 kg/m  Vital signs and nurse's note reviewed General-well-nourished, well-developed female in no acute distress but patient appears fatigued Lungs-clear to auscultation bilaterally Cardiovascular-regular rate and rhythm Abdomen-soft, patient with positive epigastric tenderness to palpation, no rebound or guarding Chest wall- patient with complaint of tenderness to palpation on the lower left chest wall/ribs below the center of the left breast.  Patient also with complaint of tenderness to palpation along the left breast medial to the areola Back-no CVA tenderness Extremities-no edema       Assessment & Plan:  1. Chest wall pain Patient has had MRI which did not show any abnormality related to the lower left chest wall or to her abdomen.  Patient however continues to have less chest wall pain as well as left breast pain and patient has been referred for diagnostic mammogram  2. Breast pain, left Patient has been referred for diagnostic mammogram but states that she has not yet received a  call back.  A note will be sent to scheduler regarding patient's mammogram  3. Epigastric pain Patient with recent positive test for H. pylori and I discussed with the patient that H. pylori is a bacteria that can cause stomach ulcers and it is very important that she obtains the antibiotic therapy and acid suppression medication and to take it as prescribed.  Patient will likely have reduction/improvement in her epigastric pain with treatment but if she continues to have pain and nausea,  patient will likely need GI referral as well.  Patient should also try to limit the use of nonsteroidal anti-inflammatories  4. Non-intractable vomiting with nausea, unspecified vomiting type Patient with complaint of recurrent vomiting and nausea.  Patient reports that she threw up slightly before the visit.  Patient was given a dose of Zofran 4 mg while in the office as well as water and graham crackers which she was able to hold down without difficulty and prescription was sent to the pharmacy for patient to pick up a refill of Zofran however she states that she will not be able to obtain this for the next 1 to 2 days.  Patient is encouraged to remain hydrated.  5. Positive H. pylori test Patient has had a recent positive H. pylori test but has not yet been able to afford the amoxicillin and Biaxin as well as omeprazole which has been sent into her pharmacy but the importance of obtaining this medication and completing the course of therapy was discussed with the patient and she should call or return if she has difficulty tolerating the medication, continued nausea vomiting, worsening abdominal pain, blood in the stool/dark/sticky stools or any concerns  An After Visit Summary was printed and given to the patient.  Return in about 3 weeks (around 08/05/2018) for abdominal pain.

## 2018-08-09 ENCOUNTER — Ambulatory Visit: Payer: Self-pay | Attending: Family Medicine | Admitting: Family Medicine

## 2018-08-09 ENCOUNTER — Other Ambulatory Visit: Payer: Self-pay

## 2018-08-09 VITALS — BP 122/86 | HR 91 | Temp 98.9°F | Resp 18 | Ht 59.0 in | Wt 122.4 lb

## 2018-08-09 DIAGNOSIS — R1010 Upper abdominal pain, unspecified: Secondary | ICD-10-CM

## 2018-08-09 DIAGNOSIS — N644 Mastodynia: Secondary | ICD-10-CM

## 2018-08-09 DIAGNOSIS — B9681 Helicobacter pylori [H. pylori] as the cause of diseases classified elsewhere: Secondary | ICD-10-CM | POA: Insufficient documentation

## 2018-08-09 DIAGNOSIS — Z8249 Family history of ischemic heart disease and other diseases of the circulatory system: Secondary | ICD-10-CM | POA: Insufficient documentation

## 2018-08-09 DIAGNOSIS — F17210 Nicotine dependence, cigarettes, uncomplicated: Secondary | ICD-10-CM | POA: Insufficient documentation

## 2018-08-09 DIAGNOSIS — A048 Other specified bacterial intestinal infections: Secondary | ICD-10-CM

## 2018-08-09 DIAGNOSIS — R110 Nausea: Secondary | ICD-10-CM

## 2018-08-09 MED ORDER — CLARITHROMYCIN 500 MG PO TABS: 500.0000 mg | ORAL_TABLET | Freq: Two times a day (BID) | ORAL | 0 refills | Status: DC

## 2018-08-09 MED ORDER — ONDANSETRON 4 MG PO TBDP: 4.0000 mg | ORAL_TABLET | Freq: Three times a day (TID) | ORAL | 0 refills | Status: DC | PRN

## 2018-08-09 MED ORDER — AMOXICILLIN 500 MG PO CAPS: 1000.0000 mg | ORAL_CAPSULE | Freq: Two times a day (BID) | ORAL | 0 refills | Status: DC

## 2018-08-09 MED FILL — AMOXICILLIN 500 MG CAPSULE: 500 | 10 days supply | Qty: 40 | Fill #0

## 2018-08-09 MED FILL — CLARITHROMYCIN 500 MG TAB: 500 | 10 days supply | Qty: 20 | Fill #0

## 2018-08-09 MED FILL — ONDANSETRON ODT 4 MG TABLET: 4 | 6 days supply | Qty: 20 | Fill #0

## 2018-08-09 NOTE — Progress Notes (Signed)
Flu shot: gotten Pain: left side, months, 4  Patient was unable to get medications from last visit due to financial reasons.   Patient stated that she went to the breast center for an appointment and she was informed she was not able to make an appointment at this time due to her not being 28 yet and her not knowing her family history.

## 2018-08-09 NOTE — Progress Notes (Signed)
Subjective:    Patient ID: [name], female    DOB: 26-Aug-1991, 27 y.o.   MRN: [id number]  HPI 27 yo female who is seen in follow-up of issues with left sided chest wall and breast pain as well as epigastric pain with a positive test for H. Pylori.  Patient states that she has been unable to obtain the antibiotics but will pick them up from the pharmacy today.  Patient states that she does continue to have epigastric pain/upper abdominal pain as well as pain in her left lateral breast.  Patient states that she was told by the breast center that due to her age, she was not eligible to have a diagnostic mammogram in follow-up of her breast pain.  Patient with complaint of recurrent, dull, aching pain in the left breast that has now been ongoing for months.  Patient denies any possibility of current pregnancy.  Patient also with continued nausea on a daily basis.  She reports that upper abdominal pain/epigastric pain sometimes has a burning quality and sometimes is dull and achy and occasionally crampy.  Patient denies any diarrhea or constipation.  No blood in the stool.  No dark/sticky stools.  Patient is taking the omeprazole twice daily. Past Medical History:  Diagnosis Date  . GERD (gastroesophageal reflux disease)   . MRSA (methicillin resistant staph aureus) culture positive   . Positive H. pylori test   . Social History   Tobacco Use  . Smoking status: Current Every Day Smoker    Packs/day: 0.50    Types: Cigarettes  . Smokeless tobacco: Never Used  Substance Use Topics  . Alcohol use: No  . Drug use: No   Family History  Problem Relation Age of Onset  . Hypertension Unknown    No Known Allergies    Review of Systems  Constitutional: Positive for fatigue. Negative for chills and fever.  HENT: Negative for sore throat, trouble swallowing and voice change.   Respiratory: Negative for cough and shortness of breath.   Cardiovascular: Negative for chest pain, palpitations and  leg swelling.  Gastrointestinal: Positive for abdominal pain and nausea. Negative for blood in stool, constipation, diarrhea and vomiting.  Genitourinary: Negative for difficulty urinating, dysuria and frequency.  Musculoskeletal: Negative for arthralgias, back pain, gait problem and joint swelling.  Neurological: Negative for dizziness and headaches.  Psychiatric/Behavioral: Negative for suicidal ideas. The patient is nervous/anxious.        Objective:   Physical Exam BP 122/86   Pulse 91   Temp 98.9 F (37.2 C) (Oral)   Resp 18   Ht 4\' 11"  (1.499 m)   Wt 122 lb 6.4 oz (55.5 kg)   LMP 08/02/2018   SpO2 98%   BMI 24.72 kg/m Nurse's notes and vital signs reviewed. General-well-nourished, well-developed adult female in no acute distress.  Patient does appear to be slightly anxious. Lungs-clear to auscultation bilaterally Cardiovascular-regular rate and rhythm Abdomen-patient with epigastric and left upper quadrant discomfort to palpation, no rebound or guarding.  Patient also with complaint of sensation of nausea with epigastric palpation. Back-no CVA tenderness Chest- patient with complaint of some generalized discomfort in the left lateral breast with palpation Psych- patient appears to be anxious otherwise normal mood and judgment        Assessment & Plan:  1. Positive H. pylori test Patient has had a recent positive test for H. pylori however she reports that she has been unable to afford to pick up the antibiotics for completion of  therapy but hopes to be able to do so today.  Discussed with the patient that hopefully treatment with the antibiotics and continue PPI therapy will result in resolution of her epigastric and upper abdominal pain but patient encouraged to return or call the office if her pain continues she will likely need referral to gastroenterology for further evaluation and treatment. - ondansetron (ZOFRAN ODT) 4 MG disintegrating tablet; Take 1 tablet (4 mg  total) by mouth every 8 (eight) hours as needed for nausea or vomiting.  Dispense: 20 tablet; Refill: 0 - clarithromycin (BIAXIN) 500 MG tablet; Take 1 tablet (500 mg total) by mouth 2 (two) times daily.  Dispense: 20 tablet; Refill: 0 - amoxicillin (AMOXIL) 500 MG capsule; Take 2 capsules (1,000 mg total) by mouth 2 (two) times daily.  Dispense: 40 capsule; Refill: 0  2. Breast pain, left Patient with continued complaint of left lateral breast pain.  Breast ultrasound will be ordered for further evaluation as patient states that she was not approved to have a diagnostic mammogram.  Patient is also encouraged to decrease caffeine intake as breast tenderness can also be related to breast cyst related to increased caffeine intake//hormonal changes. - US BREAST COMPLETE UNI LEFT INC AXILLA; Future  3. Upper abdominal pain Patient with continued upper abdominal pain but she is not yet been able to obtain the antibiotics needed for completion of treatment for H. pylori.  Patient's abdominal pain should resolve after completing treatment for her H. pylori but if her symptoms do not resolve, patient will be referred to GI for further evaluation and treatment.  4. Nausea Patient provided with a refill of Zofran to take as needed for nausea.  Patient's nausea should hopefully resolve once she is completed treatment for H. pylori.- ondansetron (ZOFRAN ODT) 4 MG disintegrating tablet; Take 1 tablet (4 mg total) by mouth every 8 (eight) hours as needed for nausea or vomiting.  Dispense: 20 tablet; Refill: 0  An After Visit Summary was printed and given to the patient.   Return in about 4 weeks (around 09/06/2018), or if symptoms worsen or fail to improve, for abdominal pain.

## 2018-08-14 ENCOUNTER — Encounter: Payer: Self-pay | Admitting: Family Medicine

## 2018-08-27 ENCOUNTER — Other Ambulatory Visit: Payer: Self-pay | Admitting: Obstetrics and Gynecology

## 2018-08-27 DIAGNOSIS — N644 Mastodynia: Secondary | ICD-10-CM

## 2018-09-06 ENCOUNTER — Ambulatory Visit: Payer: Self-pay | Admitting: Family Medicine

## 2018-09-21 ENCOUNTER — Encounter (HOSPITAL_COMMUNITY): Payer: Self-pay

## 2018-09-21 ENCOUNTER — Ambulatory Visit
Admission: RE | Admit: 2018-09-21 | Discharge: 2018-09-21 | Disposition: A | Discharge status: Home / Self Care | Payer: Self-pay | Source: Ambulatory Visit | Attending: Obstetrics and Gynecology | Admitting: Obstetrics and Gynecology

## 2018-09-21 ENCOUNTER — Encounter (HOSPITAL_COMMUNITY): Payer: Self-pay | Admitting: *Deleted

## 2018-09-21 ENCOUNTER — Ambulatory Visit (HOSPITAL_COMMUNITY)
Admission: RE | Admit: 2018-09-21 | Discharge: 2018-09-21 | Disposition: A | Discharge status: Home / Self Care | Payer: Self-pay | Source: Ambulatory Visit | Attending: Obstetrics and Gynecology | Admitting: Obstetrics and Gynecology

## 2018-09-21 VITALS — BP 114/76 | Wt 125.0 lb

## 2018-09-21 DIAGNOSIS — Z1239 Encounter for other screening for malignant neoplasm of breast: Secondary | ICD-10-CM

## 2018-09-21 DIAGNOSIS — N644 Mastodynia: Secondary | ICD-10-CM

## 2018-09-21 DIAGNOSIS — N6321 Unspecified lump in the left breast, upper outer quadrant: Secondary | ICD-10-CM

## 2018-09-21 HISTORY — DX: Anemia, unspecified: D64.9

## 2018-09-21 NOTE — Patient Instructions (Addendum)
Explained breast self awareness with Rodman KeyEvalee Archuleta. Unable to complete patients Pap smear today due to patient is currently on her menstrual cycle. Patient scheduled to have her Pap smear completed at the free cervical cancer screening at [facility]. Appointment scheduled for Monday, December 06, 2018 at 1800. Let her know [facility] will cover Pap smears every 3 years unless has a history of abnormal Pap smears. Referred patient to [facility] for a left breast ultrasound. Appointment scheduled for Tuesday, September 21, 2018 at 1130. Patient aware of appointments and will be there. Discussed smoking cessation with patient. Referred to the [facility] Quitline and gave resources to the free smoking cessation classes at [facility]. Rodman KeyEvalee Swire verbalized understanding.  Roshanda Balazs, Kathaleen Maserhristine Poll, RN 11:46 AM

## 2018-09-21 NOTE — Progress Notes (Signed)
Complaints of left breast lump and pain x a few months. Patient states the lump has increased in size. Patient stated that pain is constant. Patient rates the pain at a 10 out of 10.  Pap Smear: Pap smear not completed today. Last Pap smear was 11 years ago and normal per patient. Unable to complete patients Pap smear today due to patient is currently on her menstrual cycle. Patient scheduled to have her Pap smear completed at the free cervical cancer screening at the [facility]. Appointment scheduled for Monday, December 06, 2018 at 1800. Per patient has no history of an abnormal Pap smear. No Pap smear results are in Epic.  Physical exam: Breasts Breasts symmetrical. No skin abnormalities bilateral breasts. No nipple retraction bilateral breasts. No nipple discharge bilateral breasts. No lymphadenopathy. No lumps palpated right breast. Palpated a lump within the left breast at 1 o'clock 4 cm from the nipple. Complaints of left center breast pain pain on exam. [facility] to [facility] for a left breast ultrasound. Appointment scheduled for Tuesday, September 21, 2018 at 1130.        Pelvic/Bimanual No Pap smear completed today since patient is currently on her menstrual period.  Smoking History: Patient is a current smoker. Discussed smoking cessation with patient. Referred to the [facility] Quitline and gave resources to the free smoking cessation classes at [facility].  Patient Navigation: Patient education provided. Access to services provided for patient through B[facility]CP program.   Breast and Cervical Cancer Risk Assessment: Patient has a family history of her paternal grandmother having breast cancer. Patient has no known genetic mutations or history of radiation treatment to the chest before age 27. Patient has no history of cervical dysplasia, immunocompromised, or DES exposure in-utero. Breast Cancer risk assessment completed. Breast Cancer risk not  calculated due to patient is less than 29 years old.

## 2018-12-06 ENCOUNTER — Ambulatory Visit: Payer: Self-pay

## 2019-04-13 ENCOUNTER — Other Ambulatory Visit: Payer: Self-pay

## 2019-04-13 DIAGNOSIS — Z20822 Contact with and (suspected) exposure to covid-19: Secondary | ICD-10-CM

## 2019-04-18 LAB — NOVEL CORONAVIRUS, NAA: SARS-CoV-2, NAA: NOT DETECTED

## 2019-07-13 ENCOUNTER — Other Ambulatory Visit: Payer: Self-pay

## 2019-07-13 DIAGNOSIS — Z20822 Contact with and (suspected) exposure to covid-19: Secondary | ICD-10-CM

## 2019-07-14 LAB — NOVEL CORONAVIRUS, NAA: SARS-CoV-2, NAA: NOT DETECTED

## 2019-12-23 IMAGING — MR MR ABDOMEN WO/W CM
11 of 18 series · 21 of 48 positions shown · IV contrast (5 GV)
Comparison: 07/07/2017

CLINICAL DATA: Left upper quadrant chest wall mass.

EXAM:
MRI ABDOMEN WITHOUT AND WITH CONTRAST
TECHNIQUE: Multiplanar multisequence MR imaging of the abdomen was performed
both before and after the administration of intravenous contrast.
CONTRAST:  5 cc of Gadavist

[Series 3: cor ssfse rt · coronal · 6.0mm · 0.70mm/px · 1 of 33 slices shown]
[im 1/33]
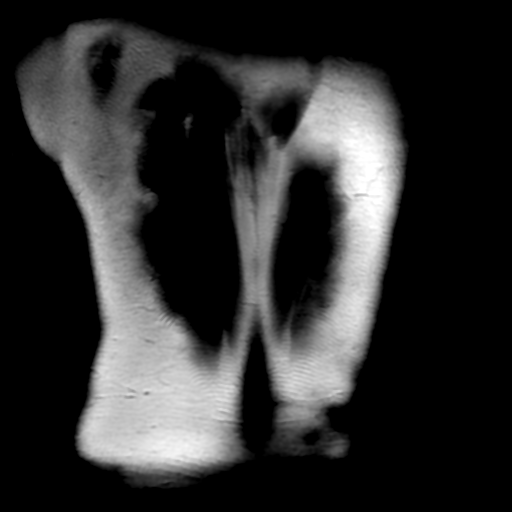

[Series 5: ax ssfse rt · axial · 6.0mm · 0.70mm/px · 1 of 48 slices shown]
[im 1/48]
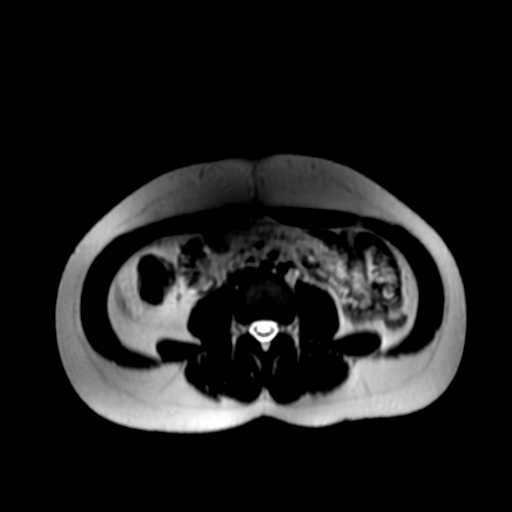

[Series 6: DWI b500 · axial · 8.0mm · 1.56mm/px · 1 of 44 slices shown]
[im 1/44]
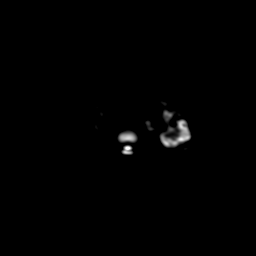

[Series 7: bSSFP · axial · 6.0mm · 0.70mm/px · 1 of 48 slices shown]
[im 1/48]
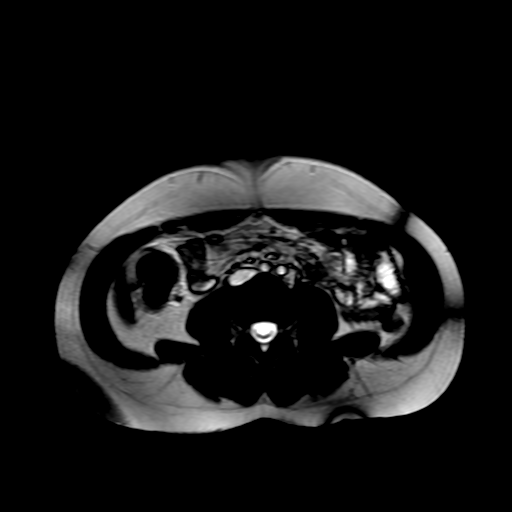

[Series 8: T2 fat-sat · axial · 6.0mm · 0.70mm/px · z∈[-145,+137]mm · 2 of 48 slices shown]
[im 1/48]
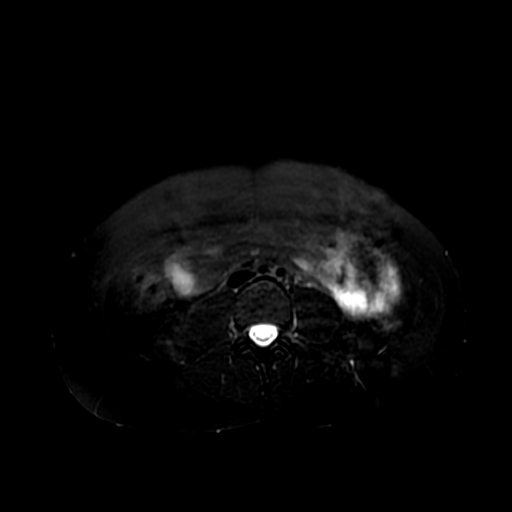
[im 48/48]
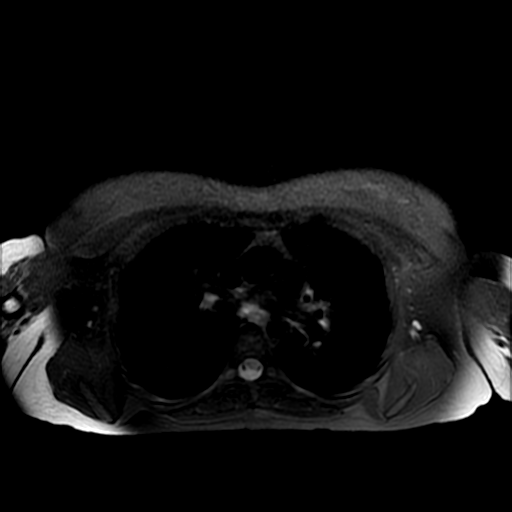

[Series 11: T1 dynamic · coronal · 4.0mm · 0.70mm/px · 2 of 56 slices shown]
[im 1/56]
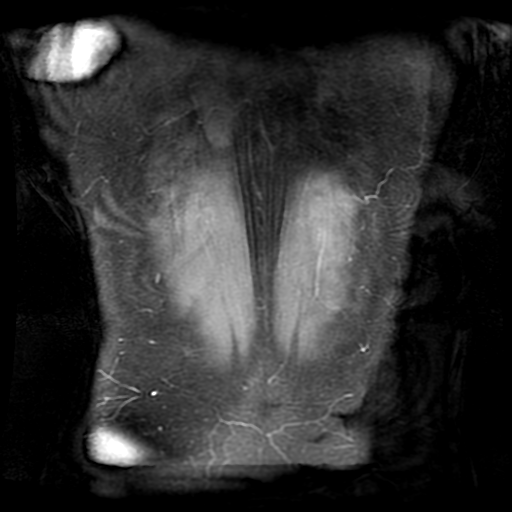
[im 56/56]
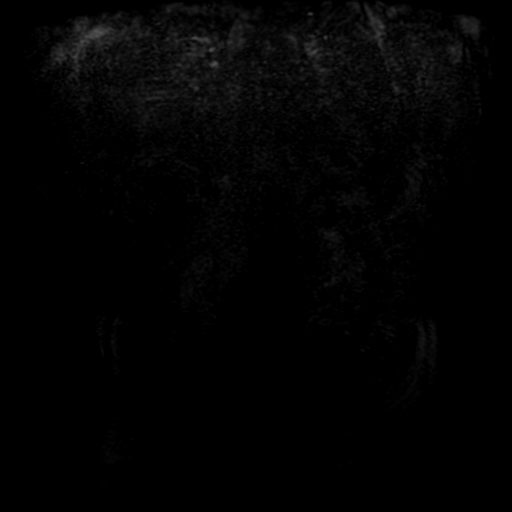

[Series 650: ADC · axial · 8.0mm · 1.56mm/px · 1 of 22 slices shown]
[im 1/22]
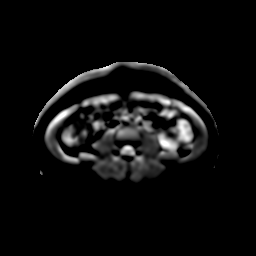

[Series 1000: T1 dynamic post-contrast · axial · non-contrast · 4.0mm · 0.78mm/px · z∈[-145,+139]mm · 3 of 72 slices shown (1 of 4)]
[im 1/72]
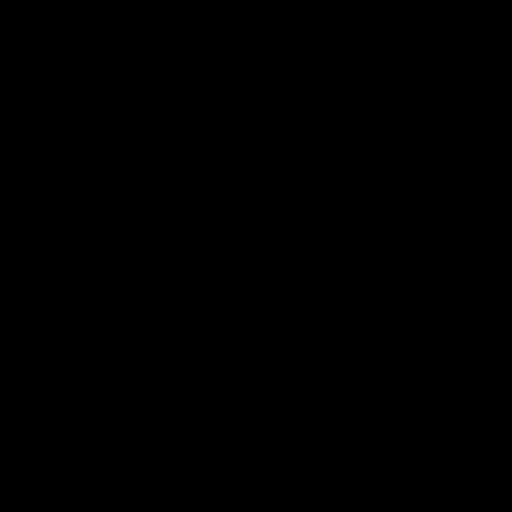
[im 36/72]
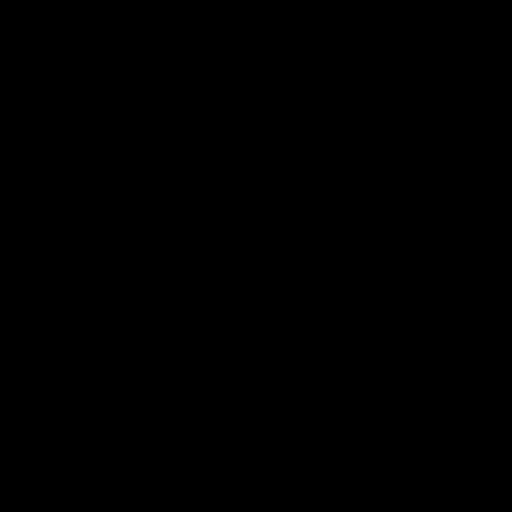
[im 72/72]
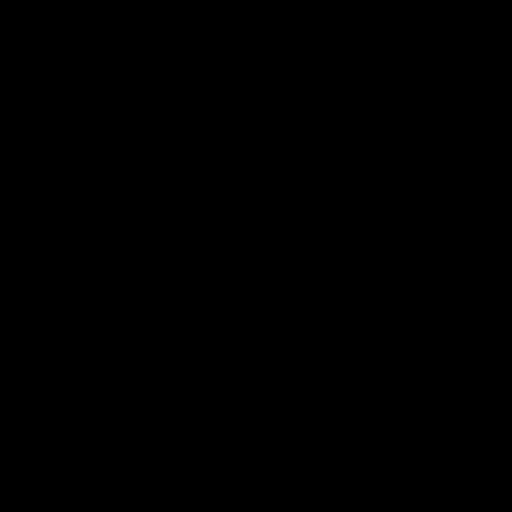

[Series 1001: T1 dynamic post-contrast · axial · non-contrast · 4.0mm · 0.78mm/px · z∈[-145,+139]mm · 3 of 72 slices shown (2 of 4)]
[im 1/72]
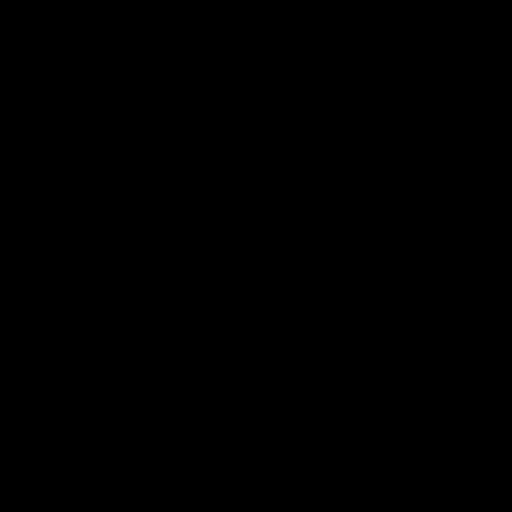
[im 36/72]
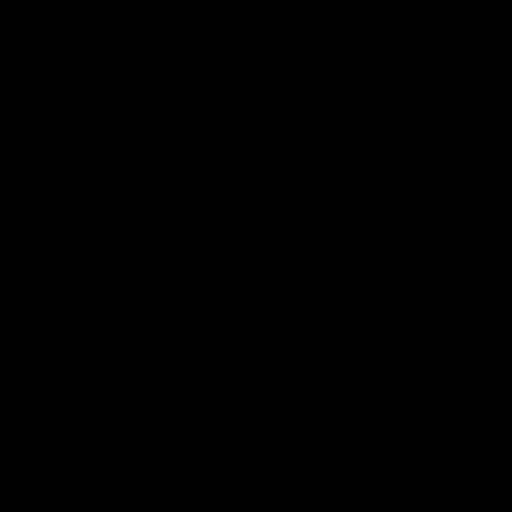
[im 72/72]
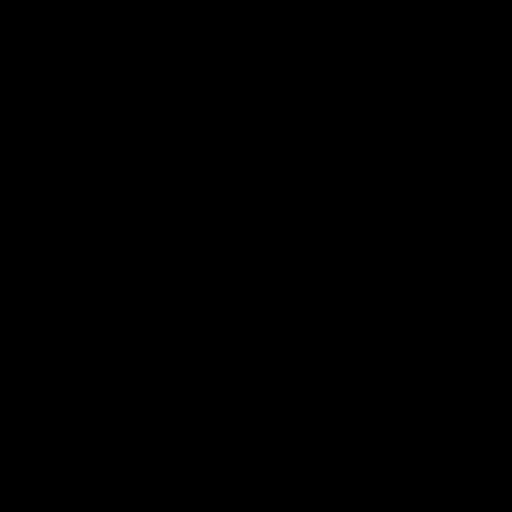

[Series 1002: T1 dynamic post-contrast · axial · non-contrast · 4.0mm · 0.78mm/px · z∈[-145,+139]mm · 3 of 72 slices shown (3 of 4)]
[im 1/72]
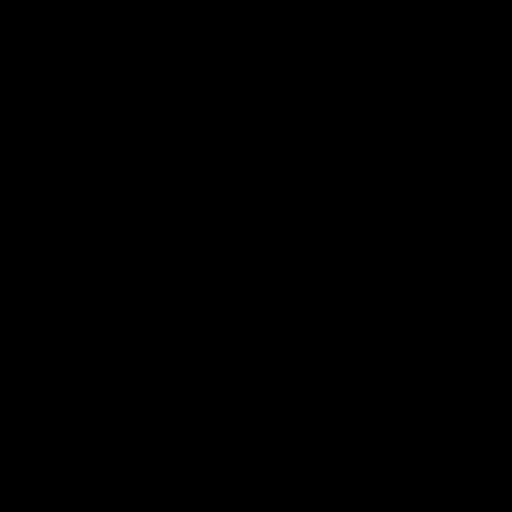
[im 36/72]
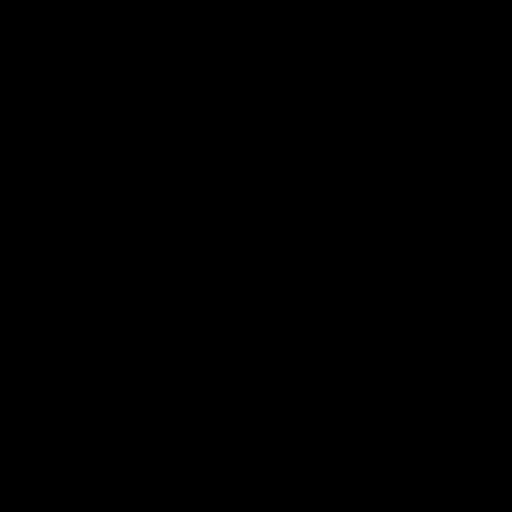
[im 72/72]
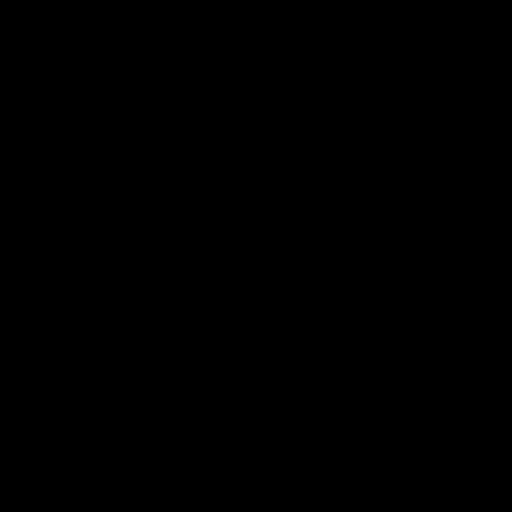

[Series 1003: T1 dynamic post-contrast · axial · non-contrast · 4.0mm · 0.78mm/px · z∈[-145,+139]mm · 3 of 72 slices shown (4 of 4)]
[im 1/72]
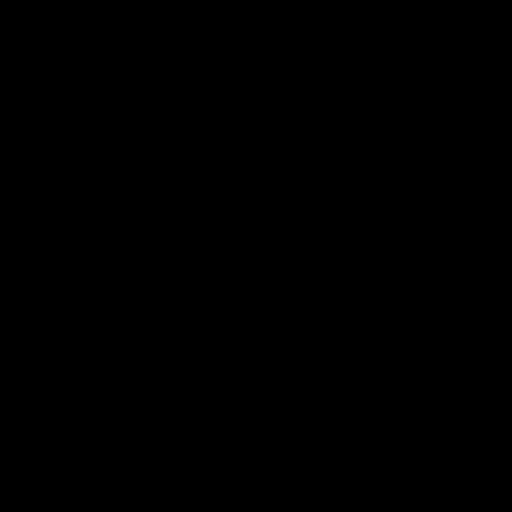
[im 36/72]
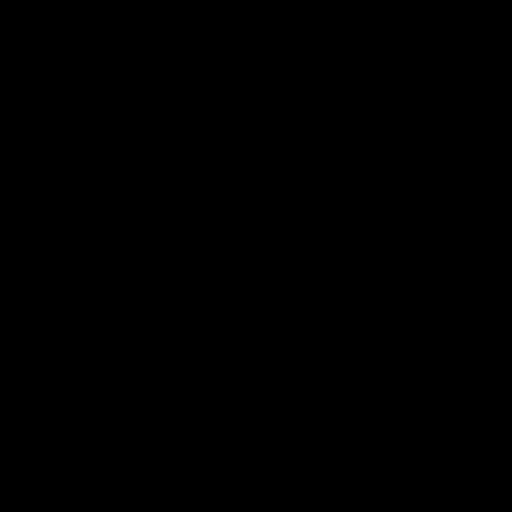
[im 72/72]
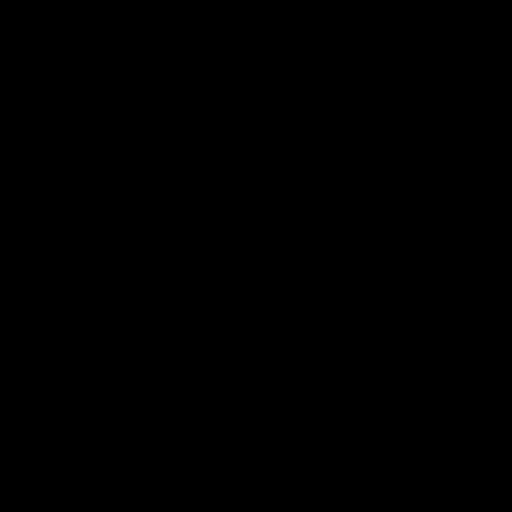

[21 of 48 positions shown; findings below may reference images not displayed]

FINDINGS: Lower chest: No acute findings.

Hepatobiliary: No mass or other parenchymal abnormality identified.

Pancreas: No mass, inflammatory changes, or other parenchymal
abnormality identified.

Spleen:  Within normal limits in size and appearance.

Adrenals/Urinary Tract: No masses identified. No evidence of
hydronephrosis. Small cyst in the upper pole of right kidney
measures 6 mm.

Stomach/Bowel: Visualized portions within the abdomen are
unremarkable.

Vascular/Lymphatic: No pathologically enlarged lymph nodes
identified. No abdominal aortic aneurysm demonstrated.

Other:  None.

Musculoskeletal: No suspicious bone lesions identified.
IMPRESSION: 1. Normal exam. No explanation for patient's pain. No chest wall
mass identified.

## 2020-05-13 ENCOUNTER — Emergency Department (HOSPITAL_COMMUNITY)
Admission: EM | Admit: 2020-05-13 | Discharge: 2020-05-13 | Discharge status: Against Medical Advice | Payer: No Typology Code available for payment source

## 2020-05-13 ENCOUNTER — Other Ambulatory Visit: Payer: Self-pay

## 2020-07-31 ENCOUNTER — Other Ambulatory Visit: Payer: Self-pay

## 2020-07-31 ENCOUNTER — Ambulatory Visit: Payer: Self-pay | Attending: Family Medicine | Admitting: Family Medicine

## 2020-07-31 ENCOUNTER — Encounter: Payer: Self-pay | Admitting: Family Medicine

## 2020-07-31 ENCOUNTER — Other Ambulatory Visit: Payer: Self-pay | Admitting: Family Medicine

## 2020-07-31 VITALS — BP 103/70 | HR 89 | Ht 59.5 in | Wt 138.0 lb

## 2020-07-31 DIAGNOSIS — Z23 Encounter for immunization: Secondary | ICD-10-CM

## 2020-07-31 DIAGNOSIS — R21 Rash and other nonspecific skin eruption: Secondary | ICD-10-CM

## 2020-07-31 DIAGNOSIS — L2489 Irritant contact dermatitis due to other agents: Secondary | ICD-10-CM

## 2020-07-31 MED ORDER — TRIAMCINOLONE ACETONIDE 0.1 % EX CREA: 1.0000 "application " | TOPICAL_CREAM | Freq: Two times a day (BID) | CUTANEOUS | 1 refills | Status: DC

## 2020-07-31 MED FILL — TRIAMCINOLONE 0.1% CREAM: 0.1 | 13 days supply | Qty: 30 | Fill #0

## 2020-07-31 NOTE — Patient Instructions (Signed)
Contact Dermatitis °Dermatitis is redness, soreness, and swelling (inflammation) of the skin. Contact dermatitis is a reaction to something that touches the skin. °There are two types of contact dermatitis: °· Irritant contact dermatitis. This happens when something bothers (irritates) your skin, like soap. °· Allergic contact dermatitis. This is caused when you are exposed to something that you are allergic to, such as poison ivy. °What are the causes? °· Common causes of irritant contact dermatitis include: °? Makeup. °? Soaps. °? Detergents. °? Bleaches. °? Acids. °? Metals, such as nickel. °· Common causes of allergic contact dermatitis include: °? Plants. °? Chemicals. °? Jewelry. °? Latex. °? Medicines. °? Preservatives in products, such as clothing. °What increases the risk? °· Having a job that exposes you to things that bother your skin. °· Having asthma or eczema. °What are the signs or symptoms? °Symptoms may happen anywhere the irritant has touched your skin. Symptoms include: °· Dry or flaky skin. °· Redness. °· Cracks. °· Itching. °· Pain or a burning feeling. °· Blisters. °· Blood or clear fluid draining from skin cracks. °With allergic contact dermatitis, swelling may occur. This may happen in places such as the eyelids, mouth, or genitals. °How is this treated? °· This condition is treated by checking for the cause of the reaction and protecting your skin. Treatment may also include: °? Steroid creams, ointments, or medicines. °? Antibiotic medicines or other ointments, if you have a skin infection. °? Lotion or medicines to help with itching. °? A bandage (dressing). °Follow these instructions at home: °Skin care °· Moisturize your skin as needed. °· Put cool cloths on your skin. °· Put a baking soda paste on your skin. Stir water into baking soda until it looks like a paste. °· Do not scratch your skin. °· Avoid having things rub up against your skin. °· Avoid the use of soaps, perfumes, and  dyes. °Medicines °· Take or apply over-the-counter and prescription medicines only as told by your doctor. °· If you were prescribed an antibiotic medicine, take or apply it as told by your doctor. Do not stop using it even if your condition starts to get better. °Bathing °· Take a bath with: °? Epsom salts. °? Baking soda. °? Colloidal oatmeal. °· Bathe less often. °· Bathe in warm water. Avoid using hot water. °Bandage care °· If you were given a bandage, change it as told by your health care provider. °· Wash your hands with soap and water before and after you change your bandage. If soap and water are not available, use hand sanitizer. °General instructions °· Avoid the things that caused your reaction. If you do not know what caused it, keep a journal. Write down: °? What you eat. °? What skin products you use. °? What you drink. °? What you wear in the area that has symptoms. This includes jewelry. °· Check the affected areas every day for signs of infection. Check for: °? More redness, swelling, or pain. °? More fluid or blood. °? Warmth. °? Pus or a bad smell. °· Keep all follow-up visits as told by your doctor. This is important. °Contact a doctor if: °· You do not get better with treatment. °· Your condition gets worse. °· You have signs of infection, such as: °? More swelling. °? Tenderness. °? More redness. °? Soreness. °? Warmth. °· You have a fever. °· You have new symptoms. °Get help right away if: °· You have a very bad headache. °· You have neck pain. °·   Your neck is stiff. °· You throw up (vomit). °· You feel very sleepy. °· You see red streaks coming from the area. °· Your bone or joint near the area hurts after the skin has healed. °· The area turns darker. °· You have trouble breathing. °Summary °· Dermatitis is redness, soreness, and swelling of the skin. °· Symptoms may occur where the irritant has touched you. °· Treatment may include medicines and skin care. °· If you do not know what caused  your reaction, keep a journal. °· Contact a doctor if your condition gets worse or you have signs of infection. °This information is not intended to replace advice given to you by your health care provider. Make sure you discuss any questions you have with your health care provider. °Document Revised: 01/19/2019 Document Reviewed: 04/14/2018 °Elsevier Patient Education © 2020 [employer] ° °

## 2020-07-31 NOTE — Progress Notes (Signed)
Has rash on face for 2 months.

## 2020-07-31 NOTE — Progress Notes (Signed)
Subjective:  Patient ID: [name], female    DOB: 27-Oct-1990  Age: 29 y.o. MRN: [id number]  CC: New Patient (Initial Visit)   HPI [name] is a 29 year old female patient of Dr. Jillyn [name] last seen in the clinic in 10/ 2019 presents to the clinic to get reestablished. She does not take any medications chronically. Complains of a facial rash for the last 2 months and used Neosporin and OTC antifungal medications. Rash is pruritic around her mouth and sometimes gets red. She wears a mask constantly at work. She changed her mask but noticed the rash still persisted. Denies presence of rash in other body parts and has no myalgias or arthralgias.  No history of autoimmune condition.  Past Medical History:  Diagnosis Date  . Anemia   . GERD (gastroesophageal reflux disease)   . MRSA (methicillin resistant staph aureus) culture positive   . Positive H. pylori test     History reviewed. No pertinent surgical history.  Family History  Problem Relation Age of Onset  . Hypertension Other   . Diabetes Maternal Grandmother   . Breast cancer Paternal Grandmother   . Diabetes Paternal Grandmother     No Known Allergies  Outpatient Medications Prior to Visit  Medication Sig Dispense Refill  . acetaminophen (TYLENOL) 500 MG tablet Take 1,000 mg by mouth every 6 (six) hours as needed for moderate pain. (Patient not taking: Reported on 07/31/2020)    . Vitamin D, Ergocalciferol, (DRISDOL) 50000 units CAPS capsule Take 1 capsule (50,000 Units total) by mouth every 7 (seven) days. (Patient not taking: Reported on 09/21/2018) 16 capsule 0  . amoxicillin (AMOXIL) 500 MG capsule Take 2 capsules (1,000 mg total) by mouth 2 (two) times daily. (Patient not taking: Reported on 09/21/2018) 40 capsule 0  . clarithromycin (BIAXIN) 500 MG tablet Take 1 tablet (500 mg total) by mouth 2 (two) times daily. (Patient not taking: Reported on 09/21/2018) 20 tablet 0  . fluticasone (FLONASE) 50 MCG/ACT nasal  spray Place 2 sprays into both nostrils daily. (Patient not taking: Reported on 07/15/2018) 1 g 0  . methocarbamol (ROBAXIN) 500 MG tablet Take 2 tablets (1,000 mg total) by mouth 3 (three) times daily. X 10days then prn muscle pain/spasm (Patient not taking: Reported on 09/21/2018) 90 tablet 0  . naproxen (NAPROSYN) 500 MG tablet Take 1 tablet (500 mg total) by mouth 2 (two) times daily with a meal. X 10 days then prn pain (Patient not taking: Reported on 09/21/2018) 60 tablet 0  . omeprazole (PRILOSEC) 20 MG capsule Take 1 capsule (20 mg total) by mouth 2 (two) times daily before a meal. X 2 weeks then one daily (Patient not taking: Reported on 09/21/2018) 60 capsule 3  . ondansetron (ZOFRAN ODT) 4 MG disintegrating tablet Take 1 tablet (4 mg total) by mouth every 8 (eight) hours as needed for nausea or vomiting. (Patient not taking: Reported on 09/21/2018) 20 tablet 0  . ondansetron (ZOFRAN) 4 MG tablet Take 1 tablet (4 mg total) by mouth every 8 (eight) hours as needed for nausea or vomiting. (Patient not taking: Reported on 09/21/2018) 20 tablet 0  . sertraline (ZOLOFT) 50 MG tablet Take 1 tablet (50 mg total) by mouth at bedtime. (Patient not taking: Reported on 09/21/2018) 30 tablet 1   No facility-administered medications prior to visit.     ROS Review of Systems  Constitutional: Negative for activity change, appetite change and fatigue.  HENT: Negative for congestion, sinus pressure and sore throat.  Eyes: Negative for visual disturbance.  Respiratory: Negative for cough, chest tightness, shortness of breath and wheezing.   Cardiovascular: Negative for chest pain and palpitations.  Gastrointestinal: Negative for abdominal distention, abdominal pain and constipation.  Endocrine: Negative for polydipsia.  Genitourinary: Negative for dysuria and frequency.  Musculoskeletal: Negative for arthralgias and back pain.  Skin: Positive for rash.  Neurological: Negative for tremors,  light-headedness and numbness.  Hematological: Does not bruise/bleed easily.  Psychiatric/Behavioral: Negative for agitation and behavioral problems.    Objective:  BP 103/70   Pulse 89   Ht 4' 11.5" (1.511 m)   Wt 138 lb (62.6 kg)   SpO2 97%   BMI 27.41 kg/m   BP/Weight 07/31/2020 09/21/2018 08/09/2018  Systolic BP 103 114 122  Diastolic BP 70 76 86  Wt. (Lbs) 138 125 122.4  BMI 27.41 25.25 24.72      Physical Exam Constitutional:      General: She is not in acute distress.    Appearance: She is well-developed. She is not diaphoretic.  HENT:     Head: Normocephalic.     Right Ear: External ear normal.     Left Ear: External ear normal.     Nose: Nose normal.  Eyes:     Conjunctiva/sclera: Conjunctivae normal.     Pupils: Pupils are equal, round, and reactive to light.  Neck:     Vascular: No JVD.  Cardiovascular:     Rate and Rhythm: Normal rate and regular rhythm.     Heart sounds: Normal heart sounds. No murmur heard.  No gallop.   Pulmonary:     Effort: Pulmonary effort is normal. No respiratory distress.     Breath sounds: Normal breath sounds. No wheezing or rales.  Chest:     Chest wall: No tenderness.  Abdominal:     General: Bowel sounds are normal. There is no distension.     Palpations: Abdomen is soft. There is no mass.     Tenderness: There is no abdominal tenderness.  Musculoskeletal:        General: No tenderness. Normal range of motion.     Cervical back: Normal range of motion.  Skin:    General: Skin is warm and dry.     Findings: Rash (scaly malar rash extending to angle of mouth and chin) present.  Neurological:     Mental Status: She is alert and oriented to person, place, and time.     Deep Tendon Reflexes: Reflexes are normal and symmetric.     CMP Latest Ref Rng & Units 06/25/2018 07/07/2017 03/14/2015  Glucose 65 - 99 mg/dL 83 83 86  BUN 6 - 20 mg/dL 18 9 7   Creatinine 0.57 - 1.00 mg/dL 2.83 6.62  Sodium 134 - 144 mmol/L 140  136 136  Potassium 3.5 - 5.2 mmol/L 3.9 3.8 3.7  Chloride 96 - 106 mmol/L 102 107 104  CO2 20 - 29 mmol/L 20 20(L) 24  Calcium 8.7 - 10.2 mg/dL 9.1 9.3 9.47)  Total Protein 6.0 - 8.5 g/dL 7.0 6.8 -  Total Bilirubin 0.0 - 1.2 mg/dL 6.5(Y 0.7 -  Alkaline Phos 39 - 117 IU/L 77 74 -  AST 0 - 40 IU/L 14 18 -  ALT 0 - 32 IU/L 8 12(L) -    Lipid Panel     Component Value Date/Time   CHOL (H) 08/25/2007 0605    245        ATP III CLASSIFICATION:  <200  mg/dL   Desirable  001-749  mg/dL   Borderline High  >=449    mg/dL   High   TRIG 87 67/59/1638 0605   HDL 53 08/25/2007 0605   CHOLHDL 4.6 08/25/2007 0605   VLDL 17 08/25/2007 0605   LDLCALC (H) 08/25/2007 0605    175        Total Cholesterol/HDL:CHD Risk Coronary Heart Disease Risk Table                     Men   Women  1/2 Average Risk   3.4   3.3    CBC    Component Value Date/Time   WBC 7.7 07/07/2018 1639   WBC 6.9 07/07/2017 0843   RBC 4.34 07/07/2018 1639   RBC 4.46 07/07/2017 0843   HGB 13.9 07/07/2018 1639   HCT 41.2 07/07/2018 1639   PLT 273 07/07/2018 1639   MCV 95 07/07/2018 1639   MCH 32.0 07/07/2018 1639   MCH 32.1 07/07/2017 0843   MCHC 33.7 07/07/2018 1639   MCHC 33.5 07/07/2017 0843   RDW 12.1 (L) 07/07/2018 1639   LYMPHSABS 2.2 07/07/2018 1639   MONOABS 0.9 03/12/2015 1700   EOSABS 0.2 07/07/2018 1639   BASOSABS 0.0 07/07/2018 1639    No results found for: HGBA1C  Assessment & Plan:  1. Malar rash Suspicious for contact dermatitis secondary to mask wearing We will need to exclude autoimmune condition - triamcinolone cream (KENALOG) 0.1 %; Apply 1 application topically 2 (two) times daily.  Dispense: 45 g; Refill: 1 - Sedimentation Rate - ANA+ENA+DNA/DS+Scl 70+SjoSSA/B - Anti-Smith antibody - C-reactive protein  2. Irritant contact dermatitis due to other agents See #1 above  3. Need for immunization against influenza - Flu Vaccine QUAD 36+ mos IM .   Meds ordered this encounter    Medications  . triamcinolone cream (KENALOG) 0.1 %    Sig: Apply 1 application topically 2 (two) times daily.    Dispense:  45 g    Refill:  1    Follow-up: Return in about 1 month (around 08/31/2020) for follow up on rash.       Hoy Register, MD, FAAFP. [facility] [place], [place] [phone]   07/31/2020, 6:00 PM

## 2020-08-01 LAB — SEDIMENTATION RATE: Sed Rate: 4 mm/hr (ref 0–32)

## 2020-08-01 LAB — ANA+ENA+DNA/DS+SCL 70+SJOSSA/B: ENA SSA (RO) Ab: <0.2 AI (ref 0.0–0.9)

## 2020-08-02 LAB — ANA+ENA+DNA/DS+SCL 70+SJOSSA/B
ANA Titer 1: NEGATIVE
ENA RNP Ab: 0.3 AI (ref 0.0–0.9)
ENA SM Ab Ser-aCnc: <0.2 AI (ref 0.0–0.9)
ENA SSA (RO) Ab: <0.2 AI (ref 0.0–0.9)
ENA SSB (LA) Ab: <0.2 AI (ref 0.0–0.9)
dsDNA Ab: 3 IU/mL (ref 0–9)

## 2020-08-02 LAB — C-REACTIVE PROTEIN: CRP: 1 mg/L (ref 0–10)

## 2020-08-17 ENCOUNTER — Telehealth: Payer: Self-pay

## 2020-08-17 NOTE — Telephone Encounter (Signed)
Message states that call can not be completed at this time.  Letter has been mailed with normal results.

## 2020-08-17 NOTE — Telephone Encounter (Signed)
-----   Message from Hoy Register, MD sent at 08/15/2020  3:43 PM EDT ----- Please inform her labs are normal

## 2020-08-31 ENCOUNTER — Ambulatory Visit: Payer: No Typology Code available for payment source | Admitting: Family Medicine

## 2021-02-07 ENCOUNTER — Encounter (HOSPITAL_COMMUNITY): Payer: Self-pay | Admitting: Emergency Medicine

## 2021-02-07 ENCOUNTER — Ambulatory Visit (HOSPITAL_COMMUNITY)
Admission: EM | Admit: 2021-02-07 | Discharge: 2021-02-07 | Disposition: A | Discharge status: Home / Self Care | Payer: No Typology Code available for payment source | Attending: Physician Assistant | Admitting: Physician Assistant

## 2021-02-07 ENCOUNTER — Other Ambulatory Visit: Payer: Self-pay

## 2021-02-07 DIAGNOSIS — L739 Follicular disorder, unspecified: Secondary | ICD-10-CM

## 2021-02-07 DIAGNOSIS — R21 Rash and other nonspecific skin eruption: Secondary | ICD-10-CM

## 2021-02-07 DIAGNOSIS — L2389 Allergic contact dermatitis due to other agents: Secondary | ICD-10-CM

## 2021-02-07 MED ORDER — DOXYCYCLINE HYCLATE 100 MG PO CAPS: 100.0000 mg | ORAL_CAPSULE | Freq: Two times a day (BID) | ORAL | 0 refills | Status: AC

## 2021-02-07 MED ORDER — TRIAMCINOLONE ACETONIDE 0.1 % EX CREA: TOPICAL_CREAM | Freq: Two times a day (BID) | CUTANEOUS | 1 refills | Status: AC

## 2021-02-07 MED ORDER — MUPIROCIN 2 % EX OINT: 1.0000 "application " | TOPICAL_OINTMENT | Freq: Two times a day (BID) | CUTANEOUS | 0 refills | Status: AC

## 2021-02-07 NOTE — ED Triage Notes (Signed)
Pt here for persistent rash around mouth.... reports she was seen by PCP in 07/2020 and was given Triamcinolone that helped but has run out.   Sts its around where she puts her mask on  Denies f/v/n/d  A&O x4... NAD.Marland Kitchen. ambulatory

## 2021-02-07 NOTE — ED Provider Notes (Signed)
[facility]    CSN: [id number] Arrival date & time: 02/07/21  1736      History   Chief Complaint Chief Complaint  Patient presents with  . Rash    HPI [name] is a 30 y.o. female.   HPI  Past Medical History:  Diagnosis Date  . Anemia   . GERD (gastroesophageal reflux disease)   . MRSA (methicillin resistant staph aureus) culture positive   . Positive H. pylori test     Patient Active Problem List   Diagnosis Date Noted  . Cellulitis and abscess 03/15/2015  . Leukocytosis 03/12/2015    History reviewed. No pertinent surgical history.  OB History    Gravida  0   Para  0   Term  0   Preterm  0   AB  0   Living  0     SAB  0   IAB  0   Ectopic  0   Multiple  0   Live Births  0            Home Medications    Prior to Admission medications   Medication Sig Start Date End Date Taking? Authorizing Provider  doxycycline (VIBRAMYCIN) 100 MG capsule Take 1 capsule (100 mg total) by mouth 2 (two) times daily. 02/07/21  Yes Braylyn Kalter, Denny Peon K, PA-C  mupirocin ointment (BACTROBAN) 2 % Apply 1 application topically 2 (two) times daily. 02/07/21  Yes Jeanita Carneiro, Noberto Retort, PA-C  acetaminophen (TYLENOL) 500 MG tablet Take 1,000 mg by mouth every 6 (six) hours as needed for moderate pain. Patient not taking: Reported on 07/31/2020    [provider]  triamcinolone cream (KENALOG) 0.1 % APPLY 1 APPLICATION TOPICALLY 2 (TWO) TIMES DAILY. 02/07/21 02/07/22  Agostino Gorin, Noberto Retort, PA-C  Vitamin D, Ergocalciferol, (DRISDOL) 50000 units CAPS capsule Take 1 capsule (50,000 Units total) by mouth every 7 (seven) days. Patient not taking: Reported on 09/21/2018 07/12/18   Anders Simmonds, PA-C    Family History Family History  Problem Relation Age of Onset  . Hypertension Other   . Diabetes Maternal Grandmother   . Breast cancer Paternal Grandmother   . Diabetes Paternal Grandmother     Social History Social History   Tobacco Use  . Smoking  status: Current Every Day Smoker    Packs/day: 0.50    Types: Cigarettes  . Smokeless tobacco: Never Used  Vaping Use  . Vaping Use: Never used  Substance Use Topics  . Alcohol use: No  . Drug use: No     Allergies   Patient has no known allergies.   Review of Systems Review of Systems  Constitutional: Negative for activity change, appetite change, fatigue and fever.  HENT: Negative for sore throat and trouble swallowing.   Respiratory: Negative for cough and shortness of breath.   Cardiovascular: Negative for chest pain.  Gastrointestinal: Negative for abdominal pain, diarrhea, nausea and vomiting.  Skin: Positive for rash.  Neurological: Negative for dizziness, light-headedness and headaches.     Physical Exam Triage Vital Signs ED Triage Vitals  Enc Vitals Group     BP 02/07/21 1853 132/87     Pulse Rate 02/07/21 1853 75     Resp 02/07/21 1853 16     Temp 02/07/21 1853 98.3 F (36.8 C)     Temp Source 02/07/21 1853 Oral     SpO2 02/07/21 1853 98 %     Weight --      Height --  Head Circumference --      Peak Flow --      Pain Score 02/07/21 1854 7     Pain Loc --      Pain Edu? --      Excl. in GC? --    No data found.  Updated Vital Signs BP 132/87 (BP Location: Left Arm)   Pulse 75   Temp 98.3 F (36.8 C) (Oral)   Resp 16   SpO2 98%   Visual Acuity Right Eye Distance:   Left Eye Distance:   Bilateral Distance:    Right Eye Near:   Left Eye Near:    Bilateral Near:     Physical Exam Vitals reviewed.  Constitutional:      General: She is awake. She is not in acute distress.    Appearance: Normal appearance. She is not ill-appearing.     Comments: Very pleasant female appears stated age in no acute distress  HENT:     Head: Normocephalic and atraumatic.     Mouth/Throat:     Pharynx: Uvula midline. No oropharyngeal exudate or posterior oropharyngeal erythema.  Cardiovascular:     Rate and Rhythm: Normal rate and regular rhythm.      Heart sounds: No murmur heard.   Pulmonary:     Effort: Pulmonary effort is normal.     Breath sounds: Normal breath sounds. No stridor. No wheezing, rhonchi or rales.     Comments: Clear to auscultation bilaterally Skin:    Findings: Rash present. Rash is macular, papular and pustular.          Comments: Maculopapular rash with multiple small pustules noted perioral region.  Psychiatric:        Behavior: Behavior is cooperative.      UC Treatments / Results  Labs (all labs ordered are listed, but only abnormal results are displayed) Labs Reviewed - No data to display  EKG   Radiology No results found.  Procedures Procedures (including critical care time)  Medications Ordered in UC Medications - No data to display  Initial Impression / Assessment and Plan / UC Course  I have reviewed the triage vital signs and the nursing notes.  Pertinent labs & imaging results that were available during my care of the patient were reviewed by me and considered in my medical decision making (see chart for details).     Symptoms consistent with perioral dermatitis with superimposed infection.  We will treat with doxycycline 100 mg twice daily for 10 days and Bactroban ointment.  Discussed that typically Kenalog is not useful in this setting but she has previously had success with this medication so was given refill to be used sparingly as needed.  She was encouraged to use hypoallergenic soaps and detergents.  Recommended she use a new brand of mask as this seemed to be a trigger.  Discussed potential utility of seeing dermatology but patient was not interested in referral due to concern for cost.  Strict return precautions given to which patient expressed understanding.  Final Clinical Impressions(s) / UC Diagnoses   Final diagnoses:  Folliculitis  Allergic contact dermatitis due to other agents     Discharge Instructions     Take doxycycline 100 mg twice daily for 10 days to  cover for infection.  You should stay out of the sun while on this medication.  Use Bactroban to cover for infection.  Once this improves you can switch to Kenalog as this previously provided some relief.  Use hypoallergenic  soaps and detergents.  Switch brands of masks to see if this improves symptoms.  If anything worsens return for reevaluation.    ED Prescriptions    Medication Sig Dispense Auth. Provider   triamcinolone cream (KENALOG) 0.1 % APPLY 1 APPLICATION TOPICALLY 2 (TWO) TIMES DAILY. 45 g Hatsue Sime K, PA-C   mupirocin ointment (BACTROBAN) 2 % Apply 1 application topically 2 (two) times daily. 22 g Offie Waide K, PA-C   doxycycline (VIBRAMYCIN) 100 MG capsule Take 1 capsule (100 mg total) by mouth 2 (two) times daily. 20 capsule Merrit Waugh, Noberto Retort, PA-C     PDMP not reviewed this encounter.   Jeani Hawking, PA-C 02/07/21 1925

## 2021-02-07 NOTE — Discharge Instructions (Addendum)
Take doxycycline 100 mg twice daily for 10 days to cover for infection.  You should stay out of the sun while on this medication.  Use Bactroban to cover for infection.  Once this improves you can switch to Kenalog as this previously provided some relief.  Use hypoallergenic soaps and detergents.  Switch brands of masks to see if this improves symptoms.  If anything worsens return for reevaluation.

## 2021-10-03 ENCOUNTER — Telehealth: Payer: No Typology Code available for payment source

## 2022-02-18 ENCOUNTER — Ambulatory Visit: Payer: No Typology Code available for payment source | Admitting: Critical Care Medicine

## 2022-02-18 NOTE — Progress Notes (Deleted)
   Established Patient Office Visit  Subjective   Patient ID: Iness Pangilinan, female    DOB: 08-Oct-1991  Age: 31 y.o. MRN: [id number]  No chief complaint on file.  Tdap pap hcv hiv  Former [facility] patient  Seen UC 01/2022 : Symptoms consistent with perioral dermatitis with superimposed infection.  We will treat with doxycycline 100 mg twice daily for 10 days and Bactroban ointment.  Discussed that typically Kenalog is not useful in this setting but she has previously had success with this medication so was given refill to be used sparingly as needed.  She was encouraged to use hypoallergenic soaps and detergents.  Recommended she use a new brand of mask as this seemed to be a trigger.  Discussed potential utility of seeing dermatology but patient was not interested in referral due to concern for cost.  Strict return precautions given to which patient expressed understanding.  Not seen in our clinic since 2021     {History (Optional):23778}  ROS    Objective:     There were no vitals taken for this visit. {Vitals History (Optional):23777}  Physical Exam   No results found for any visits on 02/18/22.  {Labs (Optional):23779}  The ASCVD Risk score (Arnett DK, et al., 2019) failed to calculate for the following reasons:   The 2019 ASCVD risk score is only valid for ages 78 to 88    Assessment & Plan:   Problem List Items Addressed This Visit   None   No follow-ups on file.    Shan Levans, MD

## 2022-03-07 ENCOUNTER — Other Ambulatory Visit: Payer: Self-pay

## 2022-08-01 ENCOUNTER — Ambulatory Visit
Admission: EM | Admit: 2022-08-01 | Discharge: 2022-08-01 | Disposition: A | Discharge status: Home / Self Care | Payer: Commercial Managed Care - HMO

## 2022-08-01 DIAGNOSIS — R0789 Other chest pain: Secondary | ICD-10-CM | POA: Diagnosis not present
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
## 2022-08-01 NOTE — ED Triage Notes (Signed)
Patient presents to [facility] for chest discomfort since 10/12. States she was in  [facility] and continues to have pain. She states it is improving but just wants to be evaluated to make sure. Taking Tylenol.
